# Patient Record
Sex: Male | Born: 1955 | Race: White | Hispanic: No | Marital: Married | Smoking: Never smoker
Health system: Southern US, Community
[De-identification: ages and names within clinical notes are randomized; demographics above are authoritative.]

## PROBLEM LIST (undated history)

## (undated) DIAGNOSIS — I442 Atrioventricular block, complete: Secondary | ICD-10-CM

## (undated) DIAGNOSIS — I428 Other cardiomyopathies: Secondary | ICD-10-CM

## (undated) HISTORY — PX: INSERT / REPLACE / REMOVE PACEMAKER: SUR710

## (undated) HISTORY — PX: VASECTOMY: SHX75

## (undated) HISTORY — DX: Atrioventricular block, complete: I44.2

## (undated) HISTORY — DX: Other cardiomyopathies: I42.8

## (undated) HISTORY — PX: UMBILICAL HERNIA REPAIR: SHX196

---

## 2004-11-02 ENCOUNTER — Ambulatory Visit: Payer: Self-pay | Admitting: Family Medicine

## 2005-06-27 ENCOUNTER — Ambulatory Visit: Payer: Self-pay | Admitting: Family Medicine

## 2005-07-06 ENCOUNTER — Ambulatory Visit: Payer: Self-pay | Admitting: Family Medicine

## 2005-10-16 ENCOUNTER — Ambulatory Visit: Payer: Self-pay | Admitting: Family Medicine

## 2005-11-08 ENCOUNTER — Ambulatory Visit: Payer: Self-pay | Admitting: Family Medicine

## 2015-07-12 DIAGNOSIS — E785 Hyperlipidemia, unspecified: Secondary | ICD-10-CM | POA: Insufficient documentation

## 2015-07-12 DIAGNOSIS — I10 Essential (primary) hypertension: Secondary | ICD-10-CM

## 2015-07-12 DIAGNOSIS — I429 Cardiomyopathy, unspecified: Secondary | ICD-10-CM | POA: Insufficient documentation

## 2015-07-12 DIAGNOSIS — Z950 Presence of cardiac pacemaker: Secondary | ICD-10-CM | POA: Insufficient documentation

## 2015-07-12 HISTORY — DX: Essential (primary) hypertension: I10

## 2015-07-12 HISTORY — DX: Cardiomyopathy, unspecified: I42.9

## 2015-07-12 HISTORY — DX: Hyperlipidemia, unspecified: E78.5

## 2015-07-12 HISTORY — DX: Presence of cardiac pacemaker: Z95.0

## 2015-11-01 DIAGNOSIS — I495 Sick sinus syndrome: Secondary | ICD-10-CM | POA: Insufficient documentation

## 2015-11-01 HISTORY — DX: Sick sinus syndrome: I49.5

## 2017-04-25 DIAGNOSIS — E782 Mixed hyperlipidemia: Secondary | ICD-10-CM | POA: Insufficient documentation

## 2017-04-25 DIAGNOSIS — K5730 Diverticulosis of large intestine without perforation or abscess without bleeding: Secondary | ICD-10-CM

## 2017-04-25 DIAGNOSIS — Z0000 Encounter for general adult medical examination without abnormal findings: Secondary | ICD-10-CM

## 2017-04-25 DIAGNOSIS — I472 Ventricular tachycardia, unspecified: Secondary | ICD-10-CM | POA: Insufficient documentation

## 2017-04-25 DIAGNOSIS — J309 Allergic rhinitis, unspecified: Secondary | ICD-10-CM | POA: Insufficient documentation

## 2017-04-25 DIAGNOSIS — I442 Atrioventricular block, complete: Secondary | ICD-10-CM | POA: Insufficient documentation

## 2017-04-25 HISTORY — DX: Encounter for general adult medical examination without abnormal findings: Z00.00

## 2017-04-25 HISTORY — DX: Allergic rhinitis, unspecified: J30.9

## 2017-04-25 HISTORY — DX: Diverticulosis of large intestine without perforation or abscess without bleeding: K57.30

## 2017-04-25 HISTORY — DX: Ventricular tachycardia: I47.2

## 2017-04-25 HISTORY — DX: Ventricular tachycardia, unspecified: I47.20

## 2017-04-25 HISTORY — DX: Mixed hyperlipidemia: E78.2

## 2017-05-01 DIAGNOSIS — Z131 Encounter for screening for diabetes mellitus: Secondary | ICD-10-CM | POA: Insufficient documentation

## 2017-05-01 HISTORY — DX: Encounter for screening for diabetes mellitus: Z13.1

## 2017-07-29 ENCOUNTER — Other Ambulatory Visit: Payer: Self-pay | Admitting: Cardiology

## 2018-04-29 DIAGNOSIS — M109 Gout, unspecified: Secondary | ICD-10-CM

## 2018-04-29 HISTORY — DX: Gout, unspecified: M10.9

## 2018-04-30 DIAGNOSIS — N183 Chronic kidney disease, stage 3 unspecified: Secondary | ICD-10-CM

## 2018-04-30 DIAGNOSIS — Z87448 Personal history of other diseases of urinary system: Secondary | ICD-10-CM

## 2018-04-30 HISTORY — DX: Chronic kidney disease, stage 3 unspecified: N18.30

## 2018-04-30 HISTORY — DX: Personal history of other diseases of urinary system: Z87.448

## 2018-12-17 DIAGNOSIS — I447 Left bundle-branch block, unspecified: Secondary | ICD-10-CM | POA: Insufficient documentation

## 2018-12-17 DIAGNOSIS — IMO0001 Reserved for inherently not codable concepts without codable children: Secondary | ICD-10-CM

## 2018-12-17 HISTORY — DX: Reserved for inherently not codable concepts without codable children: IMO0001

## 2018-12-17 HISTORY — DX: Left bundle-branch block, unspecified: I44.7

## 2019-02-03 ENCOUNTER — Ambulatory Visit: Payer: Self-pay | Admitting: Cardiology

## 2019-02-05 ENCOUNTER — Encounter: Payer: Self-pay | Admitting: Cardiology

## 2019-02-05 ENCOUNTER — Other Ambulatory Visit: Payer: Self-pay

## 2019-02-05 ENCOUNTER — Ambulatory Visit (INDEPENDENT_AMBULATORY_CARE_PROVIDER_SITE_OTHER): Payer: BLUE CROSS/BLUE SHIELD | Admitting: Cardiology

## 2019-02-05 VITALS — BP 130/80 | Ht 68.5 in | Wt 216.0 lb

## 2019-02-05 DIAGNOSIS — I10 Essential (primary) hypertension: Secondary | ICD-10-CM | POA: Diagnosis not present

## 2019-02-05 DIAGNOSIS — I442 Atrioventricular block, complete: Secondary | ICD-10-CM | POA: Diagnosis not present

## 2019-02-05 DIAGNOSIS — Z950 Presence of cardiac pacemaker: Secondary | ICD-10-CM | POA: Diagnosis not present

## 2019-02-05 DIAGNOSIS — I420 Dilated cardiomyopathy: Secondary | ICD-10-CM | POA: Diagnosis not present

## 2019-02-05 NOTE — Patient Instructions (Signed)
Medication Instructions:  Your physician recommends that you continue on your current medications as directed. Please refer to the Current Medication list given to you today.  If you need a refill on your cardiac medications before your next appointment, please call your pharmacy.   Lab work: None.  If you have labs (blood work) drawn today and your tests are completely normal, you will receive your results only by: . MyChart Message (if you have MyChart) OR . A paper copy in the mail If you have any lab test that is abnormal or we need to change your treatment, we will call you to review the results.  Testing/Procedures: None.   Follow-Up: At [facility], you and your health needs are our priority.  As part of our continuing mission to provide you with exceptional heart care, we have created designated Provider Care Teams.  These Care Teams include your primary Cardiologist (physician) and Advanced Practice Providers (APPs -  Physician Assistants and Nurse Practitioners) who all work together to provide you with the care you need, when you need it. You will need a follow up appointment in 3 months.  Please call our office 2 months in advance to schedule this appointment.  You may see No primary care provider on file. or another member of our [facility] Provider Team in [place]: Brian Munley, MD . Rajan Revankar, MD  Any Other Special Instructions Will Be Listed Below (If Applicable).     

## 2019-02-05 NOTE — Progress Notes (Signed)
Cardiology Office Note:    Date:  02/05/2019   ID:  Seth Schneider, DOB December 06, 1955, MRN [id number]  PCP:  Olive Bass, MD  Cardiologist:  Gypsy Balsam, MD    Referring MD: Olive Bass, MD   Chief Complaint  Patient presents with   Follow-up  I need to be reestablished with the patient in the practice  History of Present Illness:    Seth Schneider is a 63 y.o. male in 2014 left started having problems he was identified to have significant cardiomyopathy with significantly diminished left ventricular ejection fraction.  Cardiac catheterization was done showed normal coronaries.  After that he will develop periods of complete heart block and pacemaker has been implanted.  Since that time he has been doing well.  I seen him last time around 2017.  Echocardiogram at that time showed normalization of left ventricular ejection fraction.  After that we left the practice and he was followed by [place] [facility].  He did have echocardiogram done on 27 May on top of that he did see EP team and he was identified to have some excessive noise on the ventricular lead.  Conversation initiated about potentially putting extra right ventricle lead.  Apparently the reason for echocardiogram was done to determine left ventricular ejection fraction to determine exactly what is needed options are if he truly still diminished left ventricular ejection fraction ICD can be considered however if this is the case I would definitely favor augment medical therapy which is not sufficient at this for now.  Second issue was he does have left bundle branch block to begin with.  If he got mildly diminished left ventricular ejection fraction additional left ventricular lead can be beneficial.  He was unhappy with the service that he received and he want to get second opinion regarding that issue.  Overall he is doing well he denies having any chest pain tightness squeezing pressure burning chest.  He can do what he  wants.  He does not have any syncope or dizziness.  No past medical history on file.    Current Medications: Current Meds  Medication Sig   aspirin EC 81 MG tablet Take 1 tablet by mouth 2 (two) times daily.   carvedilol (COREG) 12.5 MG tablet Take 2 tablets by mouth 2 (two) times daily.      Allergies:   Patient has no known allergies.   Social History   Socioeconomic History   Marital status: Married    Spouse name: Not on file   Number of children: Not on file   Years of education: Not on file   Highest education level: Not on file  Occupational History   Not on file  Social Needs   Financial resource strain: Not on file   Food insecurity:    Worry: Not on file    Inability: Not on file   Transportation needs:    Medical: Not on file    Non-medical: Not on file  Tobacco Use   Smoking status: Never Smoker   Smokeless tobacco: Never Used  Substance and Sexual Activity   Alcohol use: Not Currently    Frequency: Never   Drug use: Never   Sexual activity: Not on file  Lifestyle   Physical activity:    Days per week: Not on file    Minutes per session: Not on file   Stress: Not on file  Relationships   Social connections:    Talks on phone: Not on file  Gets together: Not on file    Attends religious service: Not on file    Active member of club or organization: Not on file    Attends meetings of clubs or organizations: Not on file    Relationship status: Not on file  Other Topics Concern   Not on file  Social History Narrative   Not on file     Family History: The patient's family history includes Anxiety disorder in his mother; Hypertension in his mother; Parkinson's disease in his father; Thyroid disease in his mother. ROS:   Please see the history of present illness.    All 14 point review of systems negative except as described per history of present illness  EKGs/Labs/Other Studies Reviewed:      Recent Labs: No results  found for requested labs within last 8760 hours.  Recent Lipid Panel No results found for: CHOL, TRIG, HDL, CHOLHDL, VLDL, LDLCALC, LDLDIRECT  Physical Exam:    VS:  BP 130/80    Ht 5' 8.5" (1.74 m)    Wt 216 lb (98 kg)    SpO2 97%    BMI 32.36 kg/m     Wt Readings from Last 3 Encounters:  02/05/19 216 lb (98 kg)     GEN:  Well nourished, well developed in no acute distress HEENT: Normal NECK: No JVD; No carotid bruits LYMPHATICS: No lymphadenopathy CARDIAC: RRR, no murmurs, no rubs, no gallops RESPIRATORY:  Clear to auscultation without rales, wheezing or rhonchi  ABDOMEN: Soft, non-tender, non-distended MUSCULOSKELETAL:  No edema; No deformity  SKIN: Warm and dry LOWER EXTREMITIES: no swelling NEUROLOGIC:  Alert and oriented x 3 PSYCHIATRIC:  Normal affect   ASSESSMENT:    1. Complete heart block (HCC)   2. Dilated cardiomyopathy (HCC)   3. Essential hypertension   4. Pacemaker    PLAN:    In order of problems listed above:  1. Complete heart block.  Addressed with a pacemaker is some issue with ventricular lead with excessive noise conversation initiated about potentially implanting additional right ventricular lead or upgrading to biventricular pacer.  I talked to him in length about this and I told him what the rationale for those potential maneuvers however the most important piece of information that is still missing is his echocardiogram based on his ejection fraction will determine which way will be the best to proceed.  On top of that he want to get second opinion regarding his pacemaker.  Therefore, I will refer him to our EP team. 2. History of cardiomyopathy which was dilated with ejection fraction severely diminished but latest echocardiogram from 2017 showed normal left ventricular ejection fraction. Essential hypertension blood pressure today sufficiently controlled which I will continue. 4.  Pacemaker plan as outlined above. 5.  Dyslipidemia his cholesterol  is always unacceptably high but he still refused to take any medications for it  Medication Adjustments/Labs and Tests Ordered: Current medicines are reviewed at length with the patient today.  Concerns regarding medicines are outlined above.  No orders of the defined types were placed in this encounter.  Medication changes: No orders of the defined types were placed in this encounter.   Signed, Georgeanna Lea, MD, [facility] 02/05/2019 4:11 PM    [facility]

## 2019-02-06 ENCOUNTER — Telehealth: Payer: Self-pay | Admitting: *Deleted

## 2019-02-06 DIAGNOSIS — Z950 Presence of cardiac pacemaker: Secondary | ICD-10-CM

## 2019-02-06 DIAGNOSIS — I429 Cardiomyopathy, unspecified: Secondary | ICD-10-CM

## 2019-02-06 NOTE — Telephone Encounter (Signed)
Patient reports he hasn't had echo done yet and would like [place] to put a order in and schedule here. Will consult with Dr. Bing Matter.

## 2019-02-06 NOTE — Telephone Encounter (Signed)
Pt was mistaken about having an echo. Please call and verify and put in order.

## 2019-02-09 NOTE — Telephone Encounter (Signed)
Order has been place. No answer when calling and could not leave voicemail

## 2019-02-09 NOTE — Telephone Encounter (Signed)
Schedule for echo please

## 2019-02-09 NOTE — Addendum Note (Signed)
Addended by: Aleatha Borer on: 02/09/2019 12:00 PM   Modules accepted: Orders

## 2019-02-16 NOTE — Addendum Note (Signed)
Addended by: Ashok Norris on: 02/16/2019 10:48 AM   Modules accepted: Orders

## 2019-03-09 ENCOUNTER — Other Ambulatory Visit: Payer: Self-pay

## 2019-03-09 ENCOUNTER — Telehealth: Payer: Self-pay | Admitting: *Deleted

## 2019-03-09 ENCOUNTER — Ambulatory Visit (INDEPENDENT_AMBULATORY_CARE_PROVIDER_SITE_OTHER): Payer: BC Managed Care – PPO

## 2019-03-09 DIAGNOSIS — I429 Cardiomyopathy, unspecified: Secondary | ICD-10-CM

## 2019-03-09 DIAGNOSIS — Z950 Presence of cardiac pacemaker: Secondary | ICD-10-CM

## 2019-03-09 NOTE — Telephone Encounter (Signed)
Pt has appt with Dr. Theda Belfast on November 26, 2019 for pace maker check. Should he keep this or do we want to refer him to someone in our network? PLease advise.

## 2019-03-09 NOTE — Progress Notes (Deleted)
Complete echocardiogram has been performed.  Jimmy Aaric Dolph RDCS, RVT 

## 2019-03-09 NOTE — Progress Notes (Signed)
Complete echocardiogram has been performed.  Jimmy Patton Rabinovich RDCS, RVT 

## 2019-03-10 NOTE — Telephone Encounter (Signed)
Please advise what you would like

## 2019-03-11 ENCOUNTER — Other Ambulatory Visit: Payer: Self-pay | Admitting: *Deleted

## 2019-03-11 DIAGNOSIS — I442 Atrioventricular block, complete: Secondary | ICD-10-CM

## 2019-03-11 DIAGNOSIS — I10 Essential (primary) hypertension: Secondary | ICD-10-CM

## 2019-03-11 NOTE — Telephone Encounter (Signed)
Telephone call to patient. Spoke wit wife Santiago Glad. Informed her I have s[poke with Dr. Agustin Cree and he wants to refer him to Dr. Curt Bears (EP) for his pacemaker problems. Also that he wanted a BMP as his earliest convienence. Referral made and lab placed . Wife verbalized understanding.

## 2019-03-12 ENCOUNTER — Telehealth: Payer: Self-pay | Admitting: *Deleted

## 2019-03-12 DIAGNOSIS — I429 Cardiomyopathy, unspecified: Secondary | ICD-10-CM

## 2019-03-12 LAB — BASIC METABOLIC PANEL
BUN/Creatinine Ratio: 14 (ref 10–24)
BUN: 14 mg/dL (ref 8–27)
CO2: 23 mmol/L (ref 20–29)
Calcium: 9.4 mg/dL (ref 8.6–10.2)
Chloride: 104 mmol/L (ref 96–106)
Creatinine, Ser: 1.03 mg/dL (ref 0.76–1.27)
GFR calc Af Amer: 91 mL/min/{1.73_m2} (ref >59)
GFR calc non Af Amer: 79 mL/min/{1.73_m2} (ref >59)
Glucose: 88 mg/dL (ref 65–99)
Potassium: 4.5 mmol/L (ref 3.5–5.2)
Sodium: 142 mmol/L (ref 134–144)

## 2019-03-12 MED ORDER — LOSARTAN POTASSIUM 25 MG PO TABS: 25.0000 mg | ORAL_TABLET | Freq: Two times a day (BID) | ORAL | 1 refills | Status: DC

## 2019-03-12 NOTE — Telephone Encounter (Signed)
-----   Message from Park Liter, MD sent at 03/12/2019  1:06 PM EDT ----- chem7 ok, start losartan 25 bid  chem7 in 1 week

## 2019-03-12 NOTE — Telephone Encounter (Signed)
Telephone call to patient. Informed of lab results and need to start losartan 25 mg twice daily with a repeat BMP in 1 week

## 2019-03-18 ENCOUNTER — Telehealth: Payer: Self-pay | Admitting: Cardiology

## 2019-03-18 NOTE — Telephone Encounter (Signed)

## 2019-03-20 ENCOUNTER — Encounter: Payer: Self-pay | Admitting: Cardiology

## 2019-03-20 ENCOUNTER — Ambulatory Visit: Payer: BC Managed Care – PPO | Admitting: Cardiology

## 2019-03-20 ENCOUNTER — Other Ambulatory Visit: Payer: Self-pay

## 2019-03-20 VITALS — BP 126/72 | HR 67 | Ht 68.5 in | Wt 216.0 lb

## 2019-03-20 DIAGNOSIS — I428 Other cardiomyopathies: Secondary | ICD-10-CM

## 2019-03-20 LAB — CUP PACEART INCLINIC DEVICE CHECK
Brady Statistic RA Percent Paced: 20 %
Brady Statistic RV Percent Paced: 1 % — CL
Date Time Interrogation Session: 20200717040000
Implantable Lead Implant Date: 20031201
Implantable Lead Implant Date: 20031201
Implantable Lead Location: 753859
Implantable Lead Location: 753860
Implantable Lead Model: 4470
Implantable Lead Model: 5076
Implantable Lead Serial Number: 371531
Implantable Pulse Generator Implant Date: 20170629
Lead Channel Impedance Value: 407 Ohm
Lead Channel Impedance Value: 407 Ohm
Lead Channel Impedance Value: 441 Ohm
Lead Channel Impedance Value: 441 Ohm
Lead Channel Pacing Threshold Amplitude: 0.1 V
Lead Channel Pacing Threshold Amplitude: 0.1 V
Lead Channel Pacing Threshold Amplitude: 0.5 V
Lead Channel Pacing Threshold Pulse Width: 0.4 ms
Lead Channel Pacing Threshold Pulse Width: 0.4 ms
Lead Channel Pacing Threshold Pulse Width: 0.4 ms
Lead Channel Sensing Intrinsic Amplitude: 7.4 mV
Lead Channel Sensing Intrinsic Amplitude: 7.4 mV
Lead Channel Sensing Intrinsic Amplitude: 7.4 mV
Lead Channel Sensing Intrinsic Amplitude: 7.4 mV
Lead Channel Setting Pacing Amplitude: 2 V
Lead Channel Setting Pacing Amplitude: 2.5 V
Lead Channel Setting Pacing Pulse Width: 0.4 ms
Lead Channel Setting Sensing Sensitivity: 3.5 mV
Pulse Gen Serial Number: 755468

## 2019-03-20 NOTE — Progress Notes (Signed)
Electrophysiology Office Note   Date:  03/20/2019   ID:  Seth Schneider, DOB June 29, 1956, MRN [id number]  PCP:  Algis Greenhouse, MD  Cardiologist: Agustin Cree Primary Electrophysiologist:  Marlet Korte [name], MD    No chief complaint on file.    History of Present Illness: Seth Schneider is a 63 y.o. male who is being seen today for the evaluation of CHF at the request of Park Liter, MD. Presenting today for electrophysiology evaluation.  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  He has complete heart block is now status post pacemaker.  He has had an echo that showed normalization of his LV function.  Today, he denies symptoms of palpitations, chest pain, shortness of breath, orthopnea, PND, lower extremity edema, claudication, dizziness, presyncope, syncope, bleeding, or neurologic sequela. The patient is tolerating medications without difficulties.    Past Medical History:  Diagnosis Date  . Complete heart block ([place])   . Nonischemic cardiomyopathy [facility])    Past Surgical History:  Procedure Laterality Date  . INSERT / REPLACE / REMOVE PACEMAKER     [place]  . UMBILICAL HERNIA REPAIR    . VASECTOMY       Current Outpatient Medications  Medication Sig Dispense Refill  . aspirin EC 81 MG tablet Take 1 tablet by mouth 2 (two) times daily.    . carvedilol (COREG) 12.5 MG tablet Take 2 tablets by mouth 2 (two) times daily.     Marland Kitchen losartan (COZAAR) 25 MG tablet Take 1 tablet (25 mg total) by mouth 2 (two) times a day. 60 tablet 1   No current facility-administered medications for this visit.     Allergies:   Patient has no known allergies.   Social History:  The patient  reports that he has never smoked. He has never used smokeless tobacco. He reports previous alcohol use. He reports that he does not use drugs.   Family History:  The patient's family history includes Anxiety disorder in his mother; Hypertension in his mother;  Parkinson's disease in his father; Thyroid disease in his mother.    ROS:  Please see the history of present illness.   Otherwise, review of systems is positive for non.   All other systems are reviewed and negative.    PHYSICAL EXAM: VS:  BP 126/72   Pulse 67   Ht 5' 8.5" (1.74 m)   Wt 216 lb (98 kg)   BMI 32.36 kg/m  , BMI Body mass index is 32.36 kg/m. GEN: Well nourished, well developed, in no acute distress  HEENT: normal  Neck: no JVD, carotid bruits, or masses Cardiac: RRR; no murmurs, rubs, or gallops,no edema  Respiratory:  clear to auscultation bilaterally, normal work of breathing GI: soft, nontender, nondistended, + BS MS: no deformity or atrophy  Skin: warm and dry, device pocket is well healed Neuro:  Strength and sensation are intact Psych: euthymic mood, full affect  EKG:  EKG is ordered today. Personal review of the ekg ordered shows SR, IVCD  Device interrogation is reviewed today in detail.  See PaceArt for details.   Recent Labs: 03/11/2019: BUN 14; Creatinine, Ser 1.03; Potassium 4.5; Sodium 142    Lipid Panel  No results found for: CHOL, TRIG, HDL, CHOLHDL, VLDL, LDLCALC, LDLDIRECT   Wt Readings from Last 3 Encounters:  03/20/19 216 lb (98 kg)  02/05/19 216 lb (98 kg)      Other studies Reviewed: Additional studies/ records that were  reviewed today include: TTE 03/09/19  Review of the above records today demonstrates:   1. The left ventricle has mild-moderately reduced systolic function, with an ejection fraction of 40-45%. The cavity size was normal. There is moderate concentric left ventricular hypertrophy. Left ventricular diastolic Doppler parameters are consistent  with impaired relaxation. There is abnormal septal motion with wide QRS BBB or paced rhythm. Left ventrical global hypokinesis without regional wall motion abnormalities.  2. The right ventricle has normal systolic function. The cavity was normal. There is no increase in right  ventricular wall thickness.  3. No evidence of mitral valve stenosis.  4. The aortic valve is tricuspid. No stenosis of the aortic valve.  5. The aortic root, ascending aorta and aortic arch are normal in size and structure.  6. 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   ASSESSMENT AND PLAN:  1.  Complete heart block: Unfortunately does have excessive RV lead noise.  Fortunately he has had an echo with improved ejection fraction at 40 to 45%.  At this point a defibrillator is not indicated.  He does have some noise on his pacemaker RV lead.  He paces less than 1% of the time.  Due to that, we Seth Schneider increase his sensitivity so that he does not over sense.  If this does not fix the problem down the road, he may need a lead revision.  2.  Nonischemic dilated cardiomyopathy: Fortunately ejection fraction has improved.  Continue with current management per primary cardiology.    Current medicines are reviewed at length with the patient today.   The patient does not have concerns regarding his medicines.  The following changes were made today:  none  Labs/ tests ordered today include:  Orders Placed This Encounter  Procedures  . EKG 12-Lead     Disposition:   FU with Seth Schneider 1 year  Signed, Saliou Barnier Jorja Loa, MD  03/20/2019 2:28 PM     [facility] [address] [place] [address] [phone] (office) [phone] (fax)

## 2019-03-20 NOTE — Patient Instructions (Addendum)
Medication Instructions:  Your physician recommends that you continue on your current medications as directed. Please refer to the Current Medication list given to you today.  * If you need a refill on your cardiac medications before your next appointment, please call your pharmacy.   Labwork: None ordered  Testing/Procedures: None ordered  Follow-Up: Remote monitoring is used to monitor your Pacemaker from home. This monitoring reduces the number of office visits required to check your device to one time per year. It allows [place] to keep an eye on the functioning of your device to ensure it is working properly. You are scheduled for a device check from home on 06/22/19. You may send your transmission at any time that day. If you have a wireless device, the transmission will be sent automatically. After your physician reviews your transmission, you will receive a postcard with your next transmission date.  Your physician wants you to follow-up in: 1 year with Dr. Curt Bears.  You will receive a reminder letter in the mail two months in advance. If you don't receive a letter, please call our office to schedule the follow-up appointment.  Thank you for choosing [facility]!!   Trinidad Curet, RN [phone]

## 2019-03-25 LAB — BASIC METABOLIC PANEL
BUN/Creatinine Ratio: 15 (ref 10–24)
BUN: 17 mg/dL (ref 8–27)
CO2: 21 mmol/L (ref 20–29)
Calcium: 9.7 mg/dL (ref 8.6–10.2)
Chloride: 104 mmol/L (ref 96–106)
Creatinine, Ser: 1.11 mg/dL (ref 0.76–1.27)
GFR calc Af Amer: 82 mL/min/{1.73_m2} (ref >59)
GFR calc non Af Amer: 71 mL/min/{1.73_m2} (ref >59)
Glucose: 87 mg/dL (ref 65–99)
Potassium: 4.7 mmol/L (ref 3.5–5.2)
Sodium: 140 mmol/L (ref 134–144)

## 2019-03-27 ENCOUNTER — Telehealth: Payer: Self-pay | Admitting: Cardiology

## 2019-03-27 NOTE — Telephone Encounter (Signed)
Wants lab results from Tuesday

## 2019-03-30 MED ORDER — LOSARTAN POTASSIUM 25 MG PO TABS: 25.0000 mg | ORAL_TABLET | Freq: Two times a day (BID) | ORAL | 0 refills | Status: DC

## 2019-03-30 NOTE — Telephone Encounter (Signed)
Patient's wife informed of results. Losartan refilled.

## 2019-03-30 NOTE — Addendum Note (Signed)
Addended by: Linna Hoff R on: 03/30/2019 11:13 AM   Modules accepted: Orders

## 2019-04-20 ENCOUNTER — Telehealth: Payer: Self-pay | Admitting: Cardiology

## 2019-04-20 NOTE — Telephone Encounter (Signed)
Call carvadolol to [facility]

## 2019-04-22 ENCOUNTER — Other Ambulatory Visit: Payer: Self-pay | Admitting: *Deleted

## 2019-04-22 MED ORDER — CARVEDILOL 12.5 MG PO TABS: 25.0000 mg | ORAL_TABLET | Freq: Two times a day (BID) | ORAL | 1 refills | Status: DC

## 2019-04-22 NOTE — Telephone Encounter (Signed)
Carvedilol 12.5 mg twice daily called into [facility]

## 2019-05-07 ENCOUNTER — Encounter: Payer: Self-pay | Admitting: Cardiology

## 2019-05-07 ENCOUNTER — Other Ambulatory Visit: Payer: Self-pay

## 2019-05-07 ENCOUNTER — Ambulatory Visit (INDEPENDENT_AMBULATORY_CARE_PROVIDER_SITE_OTHER): Payer: BC Managed Care – PPO | Admitting: Cardiology

## 2019-05-07 VITALS — BP 116/78 | HR 74 | Ht 68.5 in | Wt 218.2 lb

## 2019-05-07 DIAGNOSIS — I429 Cardiomyopathy, unspecified: Secondary | ICD-10-CM | POA: Diagnosis not present

## 2019-05-07 DIAGNOSIS — I447 Left bundle-branch block, unspecified: Secondary | ICD-10-CM | POA: Diagnosis not present

## 2019-05-07 DIAGNOSIS — I495 Sick sinus syndrome: Secondary | ICD-10-CM | POA: Diagnosis not present

## 2019-05-07 DIAGNOSIS — I10 Essential (primary) hypertension: Secondary | ICD-10-CM | POA: Diagnosis not present

## 2019-05-07 NOTE — Progress Notes (Signed)
Cardiology Office Note:    Date:  05/07/2019   ID:  BURHAN SMYRE, DOB July 15, 1956, MRN [id number]  PCP:  Olive Bass, MD  Cardiologist:  Gypsy Balsam, MD    Referring MD: Olive Bass, MD   Chief Complaint  Patient presents with  . Follow-up  Doing very well  History of Present Illness:    Seth Schneider is a 62 y.o. male with history of cardiomyopathy, left bundle branch block, sick sinus syndrome, pacemaker present he comes today to my office for follow-up cardiac wise doing well.  Denies have any chest pain tightness squeezing pressure burning chest.  He does have a problem ventricular lead he was seen by EP team consideration was potentially upgrading his device to by V pacing.  Echocardiogram being done ejection fraction 4045% therefore he does not qualify for defibrillator.  Clinically he is doing well however I will try to augment his medical therapy.  7 will be checked today if Chem-7 is fine will double the dose of losartan.  Past Medical History:  Diagnosis Date  . Complete heart block (HCC)   . Nonischemic cardiomyopathy [facility])     Past Surgical History:  Procedure Laterality Date  . INSERT / REPLACE / REMOVE PACEMAKER     [place]  . UMBILICAL HERNIA REPAIR    . VASECTOMY      Current Medications: Current Meds  Medication Sig  . aspirin EC 81 MG tablet Take 1 tablet by mouth daily.   . carvedilol (COREG) 12.5 MG tablet Take 2 tablets (25 mg total) by mouth 2 (two) times daily.  Marland Kitchen losartan (COZAAR) 25 MG tablet Take 1 tablet (25 mg total) by mouth 2 (two) times a day.     Allergies:   Patient has no known allergies.   Social History   Socioeconomic History  . Marital status: Married    Spouse name: Not on file  . Number of children: Not on file  . Years of education: Not on file  . Highest education level: Not on file  Occupational History  . Not on file  Social Needs  . Financial resource strain: Not on file  . Food insecurity    Worry:  Not on file    Inability: Not on file  . Transportation needs    Medical: Not on file    Non-medical: Not on file  Tobacco Use  . Smoking status: Never Smoker  . Smokeless tobacco: Never Used  Substance and Sexual Activity  . Alcohol use: Not Currently    Frequency: Never  . Drug use: Never  . Sexual activity: Not on file  Lifestyle  . Physical activity    Days per week: Not on file    Minutes per session: Not on file  . Stress: Not on file  Relationships  . Social Musician on phone: Not on file    Gets together: Not on file    Attends religious service: Not on file    Active member of club or organization: Not on file    Attends meetings of clubs or organizations: Not on file    Relationship status: Not on file  Other Topics Concern  . Not on file  Social History Narrative  . Not on file     Family History: The patient's family history includes Anxiety disorder in his mother; Hypertension in his mother; Parkinson's disease in his father; Thyroid disease in his mother. ROS:   Please see the  history of present illness.    All 14 point review of systems negative except as described per history of present illness  EKGs/Labs/Other Studies Reviewed:      Recent Labs: 03/24/2019: BUN 17; Creatinine, Ser 1.11; Potassium 4.7; Sodium 140  Recent Lipid Panel No results found for: CHOL, TRIG, HDL, CHOLHDL, VLDL, LDLCALC, LDLDIRECT  Physical Exam:    VS:  BP 116/78   Pulse 74   Ht 5' 8.5" (1.74 m)   Wt 218 lb 3.2 oz (99 kg)   SpO2 97%   BMI 32.69 kg/m     Wt Readings from Last 3 Encounters:  05/07/19 218 lb 3.2 oz (99 kg)  03/20/19 216 lb (98 kg)  02/05/19 216 lb (98 kg)     GEN:  Well nourished, well developed in no acute distress HEENT: Normal NECK: No JVD; No carotid bruits LYMPHATICS: No lymphadenopathy CARDIAC: RRR, no murmurs, no rubs, no gallops RESPIRATORY:  Clear to auscultation without rales, wheezing or rhonchi  ABDOMEN: Soft, non-tender,  non-distended MUSCULOSKELETAL:  No edema; No deformity  SKIN: Warm and dry LOWER EXTREMITIES: no swelling NEUROLOGIC:  Alert and oriented x 3 PSYCHIATRIC:  Normal affect   ASSESSMENT:    1. SSS (sick sinus syndrome) ([place])   2. LBBB (left bundle branch block)   3. Essential hypertension   4. Cardiomyopathy, unspecified type ([place])    PLAN:    In order of problems listed above:  1. Sick sinus syndrome this being addressed with pacemaker.  Probably the ventricular lead.  Followed by EP team. 2. Left bundle branch block.  He can benefit from biventricular pacing therefore in the future if we need to change his device some thought should be given to consideration of by V pacing. 3. Essential hypertension blood pressure well controlled continue present management 4. Cardiomyopathy will check Chem-7 if Chem-7 is fine will double losartan   Medication Adjustments/Labs and Tests Ordered: Current medicines are reviewed at length with the patient today.  Concerns regarding medicines are outlined above.  No orders of the defined types were placed in this encounter.  Medication changes: No orders of the defined types were placed in this encounter.   Signed, Park Liter, MD, [facility] 05/07/2019 4:37 PM    [place]

## 2019-05-07 NOTE — Patient Instructions (Signed)
Medication Instructions:  Your physician recommends that you continue on your current medications as directed. Please refer to the Current Medication list given to you today.  If you need a refill on your cardiac medications before your next appointment, please call your pharmacy.   Lab work: Your physician recommends that you have the following labs drawn: BMP  If you have labs (blood work) drawn today and your tests are completely normal, you will receive your results only by: Marland Kitchen MyChart Message (if you have MyChart) OR . A paper copy in the mail If you have any lab test that is abnormal or we need to change your treatment, we will call you to review the results.  Testing/Procedures: None ordered  Follow-Up: At [facility], you and your health needs are our priority.  As part of our continuing mission to provide you with exceptional heart care, we have created designated Provider Care Teams.  These Care Teams include your primary Cardiologist (physician) and Advanced Practice Providers (APPs -  Physician Assistants and Nurse Practitioners) who all work together to provide you with the care you need, when you need it. You will need a follow up appointment in 3 months.  You may see Jenne Campus or another member of our [employer] Provider Team in [place]: Shirlee More, MD . Jyl Heinz, MD

## 2019-05-08 ENCOUNTER — Telehealth: Payer: Self-pay | Admitting: Family

## 2019-05-08 DIAGNOSIS — I10 Essential (primary) hypertension: Secondary | ICD-10-CM

## 2019-05-08 LAB — BASIC METABOLIC PANEL
BUN/Creatinine Ratio: 18 (ref 10–24)
BUN: 19 mg/dL (ref 8–27)
CO2: 20 mmol/L (ref 20–29)
Calcium: 9.7 mg/dL (ref 8.6–10.2)
Chloride: 103 mmol/L (ref 96–106)
Creatinine, Ser: 1.05 mg/dL (ref 0.76–1.27)
GFR calc Af Amer: 88 mL/min/{1.73_m2} (ref >59)
GFR calc non Af Amer: 76 mL/min/{1.73_m2} (ref >59)
Glucose: 80 mg/dL (ref 65–99)
Potassium: 4.7 mmol/L (ref 3.5–5.2)
Sodium: 139 mmol/L (ref 134–144)

## 2019-05-08 MED ORDER — LOSARTAN POTASSIUM 50 MG PO TABS: 50.0000 mg | ORAL_TABLET | Freq: Two times a day (BID) | ORAL | 3 refills | Status: DC

## 2019-05-08 NOTE — Telephone Encounter (Signed)
Called and left message per DPR.   BMP with normal kidney function and electrolytes. Increase Losartan to 50mg  twice daily. I have sent a prescription for 50mg  tablets. In the interim, he may take two 25mg  tablets twice daily to use up his current supply. Recheck BMP in 1 week.   He was asked to present to the office in 1 week between 8 and 4:30 to have his labs BMP repeated.   I have placed the order.   Loel Dubonnet, NP

## 2019-05-08 NOTE — Telephone Encounter (Signed)
-----   Message from Park Liter, MD sent at 05/08/2019 10:52 AM EDT ----- Double losatran dose to 50 bid  chem7 in 1 week

## 2019-05-20 LAB — BASIC METABOLIC PANEL
BUN/Creatinine Ratio: 17 (ref 10–24)
BUN: 17 mg/dL (ref 8–27)
CO2: 20 mmol/L (ref 20–29)
Calcium: 9.4 mg/dL (ref 8.6–10.2)
Chloride: 106 mmol/L (ref 96–106)
Creatinine, Ser: 1 mg/dL (ref 0.76–1.27)
GFR calc Af Amer: 93 mL/min/{1.73_m2} (ref >59)
GFR calc non Af Amer: 81 mL/min/{1.73_m2} (ref >59)
Glucose: 80 mg/dL (ref 65–99)
Potassium: 4.6 mmol/L (ref 3.5–5.2)
Sodium: 142 mmol/L (ref 134–144)

## 2019-06-22 ENCOUNTER — Encounter: Payer: BC Managed Care – PPO | Admitting: *Deleted

## 2019-07-07 ENCOUNTER — Ambulatory Visit (INDEPENDENT_AMBULATORY_CARE_PROVIDER_SITE_OTHER): Payer: BC Managed Care – PPO | Admitting: *Deleted

## 2019-07-07 DIAGNOSIS — I447 Left bundle-branch block, unspecified: Secondary | ICD-10-CM | POA: Diagnosis not present

## 2019-07-07 DIAGNOSIS — I442 Atrioventricular block, complete: Secondary | ICD-10-CM

## 2019-07-07 LAB — CUP PACEART REMOTE DEVICE CHECK
Battery Remaining Longevity: 138 mo
Battery Remaining Percentage: 100 %
Brady Statistic RA Percent Paced: 24 %
Brady Statistic RV Percent Paced: 0 %
Date Time Interrogation Session: 20201103093200
Implantable Lead Implant Date: 20031201
Implantable Lead Implant Date: 20031201
Implantable Lead Location: 753859
Implantable Lead Location: 753860
Implantable Lead Model: 4470
Implantable Lead Model: 5076
Implantable Lead Serial Number: 371531
Implantable Pulse Generator Implant Date: 20170629
Lead Channel Impedance Value: 400 Ohm
Lead Channel Impedance Value: 456 Ohm
Lead Channel Pacing Threshold Amplitude: 0.5 V
Lead Channel Pacing Threshold Amplitude: 0.9 V
Lead Channel Pacing Threshold Pulse Width: 0.4 ms
Lead Channel Pacing Threshold Pulse Width: 0.4 ms
Lead Channel Setting Pacing Amplitude: 2 V
Lead Channel Setting Pacing Amplitude: 2.5 V
Lead Channel Setting Pacing Pulse Width: 0.4 ms
Lead Channel Setting Sensing Sensitivity: 3.5 mV
Pulse Gen Serial Number: 755468

## 2019-07-27 ENCOUNTER — Other Ambulatory Visit: Payer: Self-pay | Admitting: Cardiology

## 2019-07-27 NOTE — Progress Notes (Signed)
Remote pacemaker transmission.   

## 2019-08-17 ENCOUNTER — Encounter: Payer: Self-pay | Admitting: Cardiology

## 2019-08-17 ENCOUNTER — Other Ambulatory Visit: Payer: Self-pay

## 2019-08-17 ENCOUNTER — Ambulatory Visit (INDEPENDENT_AMBULATORY_CARE_PROVIDER_SITE_OTHER): Payer: BC Managed Care – PPO | Admitting: Cardiology

## 2019-08-17 VITALS — BP 118/62 | HR 64 | Ht 68.5 in | Wt 223.0 lb

## 2019-08-17 DIAGNOSIS — I10 Essential (primary) hypertension: Secondary | ICD-10-CM | POA: Diagnosis not present

## 2019-08-17 DIAGNOSIS — I420 Dilated cardiomyopathy: Secondary | ICD-10-CM | POA: Diagnosis not present

## 2019-08-17 DIAGNOSIS — Z950 Presence of cardiac pacemaker: Secondary | ICD-10-CM | POA: Diagnosis not present

## 2019-08-17 MED ORDER — ENTRESTO 24-26 MG PO TABS: 1.0000 | ORAL_TABLET | Freq: Two times a day (BID) | ORAL | 1 refills | Status: DC

## 2019-08-17 NOTE — Progress Notes (Signed)
Cardiology Office Note:    Date:  08/17/2019   ID:  Seth Schneider, DOB 1955-09-22, MRN [id number]  PCP:  Olive Bass, MD  Cardiologist:  Gypsy Balsam, MD    Referring MD: Olive Bass, MD   Chief Complaint  Patient presents with  . Follow-up  Doing well  History of Present Illness:    Seth Schneider is a 63 y.o. male past medical history significant for dual-chamber pacemaker, excessive noise on the ventricular lead but he was only used at least 1% of time, cardiomyopathy with latest ejection fraction 40 to 45% previously much worse, complete heart block.  He comes today to my office for follow-up overall doing great.  Denies have any chest pain, tightness, pressure, burning in the chest.  He does what he wants to do and have no difficulty doing it.  No dizziness no passing out no palpitations  Past Medical History:  Diagnosis Date  . Complete heart block (HCC)   . Nonischemic cardiomyopathy [facility])     Past Surgical History:  Procedure Laterality Date  . INSERT / REPLACE / REMOVE PACEMAKER     [place]  . UMBILICAL HERNIA REPAIR    . VASECTOMY      Current Medications: Current Meds  Medication Sig  . aspirin EC 81 MG tablet Take 1 tablet by mouth daily.   . carvedilol (COREG) 12.5 MG tablet TAKE TWO TABLETS BY MOUTH TWICE DAILY  . losartan (COZAAR) 50 MG tablet Take 1 tablet (50 mg total) by mouth 2 (two) times daily.     Allergies:   Patient has no known allergies.   Social History   Socioeconomic History  . Marital status: Married    Spouse name: Not on file  . Number of children: Not on file  . Years of education: Not on file  . Highest education level: Not on file  Occupational History  . Not on file  Tobacco Use  . Smoking status: Never Smoker  . Smokeless tobacco: Never Used  Substance and Sexual Activity  . Alcohol use: Not Currently  . Drug use: Never  . Sexual activity: Not on file  Other Topics Concern  . Not on file  Social  History Narrative  . Not on file   Social Determinants of Health   Financial Resource Strain:   . Difficulty of Paying Living Expenses: Not on file  Food Insecurity:   . Worried About Programme researcher, broadcasting/film/video in the Last Year: Not on file  . Ran Out of Food in the Last Year: Not on file  Transportation Needs:   . Lack of Transportation (Medical): Not on file  . Lack of Transportation (Non-Medical): Not on file  Physical Activity:   . Days of Exercise per Week: Not on file  . Minutes of Exercise per Session: Not on file  Stress:   . Feeling of Stress : Not on file  Social Connections:   . Frequency of Communication with Friends and Family: Not on file  . Frequency of Social Gatherings with Friends and Family: Not on file  . Attends Religious Services: Not on file  . Active Member of Clubs or Organizations: Not on file  . Attends Banker Meetings: Not on file  . Marital Status: Not on file     Family History: The patient's family history includes Anxiety disorder in his mother; Hypertension in his mother; Parkinson's disease in his father; Thyroid disease in his mother. ROS:   Please  see the history of present illness.    All 14 point review of systems negative except as described per history of present illness  EKGs/Labs/Other Studies Reviewed:      Recent Labs: 05/19/2019: BUN 17; Creatinine, Ser 1.00; Potassium 4.6; Sodium 142  Recent Lipid Panel No results found for: CHOL, TRIG, HDL, CHOLHDL, VLDL, LDLCALC, LDLDIRECT  Physical Exam:    VS:  BP 118/62   Pulse 64   Ht 5' 8.5" (1.74 m)   Wt 223 lb (101.2 kg)   SpO2 96%   BMI 33.41 kg/m     Wt Readings from Last 3 Encounters:  08/17/19 223 lb (101.2 kg)  05/07/19 218 lb 3.2 oz (99 kg)  03/20/19 216 lb (98 kg)     GEN:  Well nourished, well developed in no acute distress HEENT: Normal NECK: No JVD; No carotid bruits LYMPHATICS: No lymphadenopathy CARDIAC: RRR, no murmurs, no rubs, no  gallops RESPIRATORY:  Clear to auscultation without rales, wheezing or rhonchi  ABDOMEN: Soft, non-tender, non-distended MUSCULOSKELETAL:  No edema; No deformity  SKIN: Warm and dry LOWER EXTREMITIES: no swelling NEUROLOGIC:  Alert and oriented x 3 PSYCHIATRIC:  Normal affect   ASSESSMENT:    1. Essential hypertension   2. Dilated cardiomyopathy ([place])   3. Pacemaker    PLAN:    In order of problems listed above:  1. Essential hypertension blood pressure well controlled continue present management. 2. Dilated cardiomyopathy with ejection fraction 4045%.  We will switch him from losartan to [facility] hopefully he will be able to tolerate this medication we will try to ramp it up 3. Pacemaker present with excessive noise on the ventricular channel however he use this lead only 1% of time therefore there is no clear-cut indication for upgrading his system or changing to ICD.  We will continue monitoring.   Medication Adjustments/Labs and Tests Ordered: Current medicines are reviewed at length with the patient today.  Concerns regarding medicines are outlined above.  No orders of the defined types were placed in this encounter.  Medication changes: No orders of the defined types were placed in this encounter.   Signed, Park Liter, MD, [facility] 08/17/2019 4:11 PM    [place]

## 2019-08-17 NOTE — Patient Instructions (Signed)
Medication Instructions:  Your physician has recommended you make the following change in your medication:   STOP: Losartan   START: Entresto 24/26 mg twice daily   *If you need a refill on your cardiac medications before your next appointment, please call your pharmacy*  Lab Work: None.  If you have labs (blood work) drawn today and your tests are completely normal, you will receive your results only by: Marland Kitchen MyChart Message (if you have MyChart) OR . A paper copy in the mail If you have any lab test that is abnormal or we need to change your treatment, we will call you to review the results.  Testing/Procedures: None.   Follow-Up: At [facility], you and your health needs are our priority.  As part of our continuing mission to provide you with exceptional heart care, we have created designated Provider Care Teams.  These Care Teams include your primary Cardiologist (physician) and Advanced Practice Providers (APPs -  Physician Assistants and Nurse Practitioners) who all work together to provide you with the care you need, when you need it.  Your next appointment:   2 month(s)  The format for your next appointment:   In Person  Provider:   Jenne Campus, MD  Other Instructions  Sacubitril; Valsartan oral tablet What is this medicine? SACUBITRIL; VALSARTAN (sak UE bi tril; val SAR tan) is a combination of 2 drugs used to reduce the risk of death and hospitalizations in people with long-lasting heart failure. It is usually used with other medicines to treat heart failure. This medicine may be used for other purposes; ask your health care provider or pharmacist if you have questions. COMMON BRAND NAME(S): Entresto What should I tell my health care provider before I take this medicine? They need to know if you have any of these conditions:  diabetes and take a medicine that contains aliskiren  kidney disease  liver disease  an unusual or allergic reaction to  sacubitril; valsartan, drugs called angiotensin converting enzyme (ACE) inhibitors, angiotensin II receptor blockers (ARBs), other medicines, foods, dyes, or preservatives  pregnant or trying to get pregnant  breast-feeding How should I use this medicine? Take this medicine by mouth with a glass of water. Follow the directions on the prescription label. You can take it with or without food. If it upsets your stomach, take it with food. Take your medicine at regular intervals. Do not take it more often than directed. Do not stop taking except on your doctor's advice. Do not take this medicine for at least 36 hours before or after you take an ACE inhibitor medicine. Talk to your health care provider if you are not sure if you take an ACE inhibitor. Talk to your pediatrician regarding the use of this medicine in children. Special care may be needed. Overdosage: If you think you have taken too much of this medicine contact a poison control center or emergency room at once. NOTE: This medicine is only for you. Do not share this medicine with others. What if I miss a dose? If you miss a dose, take it as soon as you can. If it is almost time for next dose, take only that dose. Do not take double or extra doses. What may interact with this medicine? Do not take this medicine with any of the following medicines:  aliskiren if you have diabetes  angiotensin-converting enzyme (ACE) inhibitors, like benazepril, captopril, enalapril, fosinopril, lisinopril, or ramipril This medicine may also interact with the following medicines:  angiotensin  II receptor blockers (ARBs) like azilsartan, candesartan, eprosartan, irbesartan, losartan, olmesartan, telmisartan, or valsartan  lithium  NSAIDS, medicines for pain and inflammation, like ibuprofen or naproxen  potassium-sparing diuretics like amiloride, spironolactone, and triamterene  potassium supplements This list may not describe all possible  interactions. Give your health care provider a list of all the medicines, herbs, non-prescription drugs, or dietary supplements you use. Also tell them if you smoke, drink alcohol, or use illegal drugs. Some items may interact with your medicine. What should I watch for while using this medicine? Tell your doctor or healthcare professional if your symptoms do not start to get better or if they get worse. Do not become pregnant while taking this medicine. Women should inform their doctor if they wish to become pregnant or think they might be pregnant. There is a potential for serious side effects to an unborn child. Talk to your health care professional or pharmacist for more information. You may get dizzy. Do not drive, use machinery, or do anything that needs mental alertness until you know how this medicine affects you. Do not stand or sit up quickly, especially if you are an older patient. This reduces the risk of dizzy or fainting spells. Avoid alcoholic drinks; they can make you more dizzy. What side effects may I notice from receiving this medicine? Side effects that you should report to your doctor or health care professional as soon as possible:  allergic reactions like skin rash, itching or hives, swelling of the face, lips, or tongue  signs and symptoms of increased potassium like muscle weakness; chest pain; or fast, irregular heartbeat  signs and symptoms of kidney injury like trouble passing urine or change in the amount of urine  signs and symptoms of low blood pressure like feeling dizzy or lightheaded, or if you develop extreme fatigue Side effects that usually do not require medical attention (report to your doctor or health care professional if they continue or are bothersome):  cough This list may not describe all possible side effects. Call your doctor for medical advice about side effects. You may report side effects to FDA at 1-800-FDA-1088. Where should I keep my  medicine? Keep out of the reach of children. Store at room temperature between 15 and 30 degrees C (59 and 86 degrees F). Throw away any unused medicine after the expiration date. NOTE: This sheet is a summary. It may not cover all possible information. If you have questions about this medicine, talk to your doctor, pharmacist, or health care provider.  2020 Elsevier/Gold Standard (2015-10-05 13:54:19)

## 2019-08-17 NOTE — Addendum Note (Signed)
Addended by: Ashok Norris on: 08/17/2019 04:24 PM   Modules accepted: Orders

## 2019-08-18 ENCOUNTER — Telehealth: Payer: Self-pay | Admitting: Cardiology

## 2019-08-18 NOTE — Telephone Encounter (Signed)
Called and spoke to patients wife per dpr. Advised her of patient assistance number for entresto, she will check on this and see if they can qualify for it. If not she will call back.

## 2019-08-18 NOTE — Telephone Encounter (Signed)
Please call patient due to the fact that she states that the new med is unaffordable.Seth Schneider

## 2019-10-06 ENCOUNTER — Ambulatory Visit (INDEPENDENT_AMBULATORY_CARE_PROVIDER_SITE_OTHER): Payer: BC Managed Care – PPO | Admitting: *Deleted

## 2019-10-06 DIAGNOSIS — I447 Left bundle-branch block, unspecified: Secondary | ICD-10-CM

## 2019-10-07 ENCOUNTER — Telehealth: Payer: Self-pay

## 2019-10-07 NOTE — Telephone Encounter (Signed)
I tried to help the pt to send a transmission with his monitor but was unsuccessful. I gave him the number to [facility] tech support.

## 2019-10-08 LAB — CUP PACEART REMOTE DEVICE CHECK
Date Time Interrogation Session: 20210204065607
Implantable Lead Implant Date: 20031130190000
Implantable Lead Implant Date: 20031130190000
Implantable Lead Location: 753859
Implantable Lead Location: 753860
Implantable Lead Model: 4470
Implantable Lead Model: 5076
Implantable Lead Serial Number: 371531
Implantable Pulse Generator Implant Date: 20170628200000
Pulse Gen Serial Number: 755468

## 2019-10-08 NOTE — Progress Notes (Signed)
PPM Remote  

## 2019-10-20 ENCOUNTER — Encounter: Payer: Self-pay | Admitting: Cardiology

## 2019-10-20 ENCOUNTER — Other Ambulatory Visit: Payer: Self-pay

## 2019-10-20 ENCOUNTER — Ambulatory Visit (INDEPENDENT_AMBULATORY_CARE_PROVIDER_SITE_OTHER): Payer: BC Managed Care – PPO | Admitting: Cardiology

## 2019-10-20 VITALS — BP 120/78 | HR 80 | Ht 68.5 in | Wt 227.0 lb

## 2019-10-20 DIAGNOSIS — I447 Left bundle-branch block, unspecified: Secondary | ICD-10-CM

## 2019-10-20 DIAGNOSIS — I10 Essential (primary) hypertension: Secondary | ICD-10-CM

## 2019-10-20 DIAGNOSIS — E785 Hyperlipidemia, unspecified: Secondary | ICD-10-CM | POA: Diagnosis not present

## 2019-10-20 DIAGNOSIS — I495 Sick sinus syndrome: Secondary | ICD-10-CM | POA: Diagnosis not present

## 2019-10-20 NOTE — Patient Instructions (Signed)
Medication Instructions:  Your physician recommends that you continue on your current medications as directed. Please refer to the Current Medication list given to you today.  *If you need a refill on your cardiac medications before your next appointment, please call your pharmacy*  Lab Work: Your physician recommends that you return for lab work today: bmp, lipids  If you have labs (blood work) drawn today and your tests are completely normal, you will receive your results only by: Marland Kitchen MyChart Message (if you have MyChart) OR . A paper copy in the mail If you have any lab test that is abnormal or we need to change your treatment, we will call you to review the results.  Testing/Procedures: None.   Follow-Up: At [facility], you and your health needs are our priority.  As part of our continuing mission to provide you with exceptional heart care, we have created designated Provider Care Teams.  These Care Teams include your primary Cardiologist (physician) and Advanced Practice Providers (APPs -  Physician Assistants and Nurse Practitioners) who all work together to provide you with the care you need, when you need it.  Your next appointment:   3 month(s)  The format for your next appointment:   In Person  Provider:   Gypsy Balsam, MD  Other Instructions

## 2019-10-20 NOTE — Progress Notes (Signed)
Cardiology Office Note:    Date:  10/20/2019   ID:  Seth Schneider, DOB 1956/02/20, MRN [id number]  PCP:  Algis Greenhouse, MD  Cardiologist:  Jenne Campus, MD    Referring MD: Algis Greenhouse, MD   No chief complaint on file. Doing well  History of Present Illness:    Seth Schneider is a 64 y.o. male   past medical history significant for dual-chamber pacemaker, excessive noise on the ventricular lead but he was only used at least 1% of time, cardiomyopathy with latest ejection fraction 40 to 45% previously much worse, complete heart block.  He comes today to my office for follow-up overall doing great.  Denies have any chest pain, tightness, pressure, burning in the chest.  He does what he wants to do and have no difficulty doing it.  No dizziness no passing out no palpitations. Complaining of having work a lot but overall doing well.  No cardiac complaints.  Past Medical History:  Diagnosis Date  . Complete heart block ([place])   . Nonischemic cardiomyopathy [facility])     Past Surgical History:  Procedure Laterality Date  . INSERT / REPLACE / REMOVE PACEMAKER     [place]  . UMBILICAL HERNIA REPAIR    . VASECTOMY      Current Medications: Current Meds  Medication Sig  . aspirin EC 81 MG tablet Take 1 tablet by mouth daily.   . carvedilol (COREG) 12.5 MG tablet TAKE TWO TABLETS BY MOUTH TWICE DAILY  . sacubitril-valsartan (ENTRESTO) 24-26 MG Take 1 tablet by mouth 2 (two) times daily.     Allergies:   Patient has no known allergies.   Social History   Socioeconomic History  . Marital status: Married    Spouse name: Not on file  . Number of children: Not on file  . Years of education: Not on file  . Highest education level: Not on file  Occupational History  . Not on file  Tobacco Use  . Smoking status: Never Smoker  . Smokeless tobacco: Never Used  Substance and Sexual Activity  . Alcohol use: Not Currently  . Drug use: Never  . Sexual activity: Not on file    Other Topics Concern  . Not on file  Social History Narrative  . Not on file   Social Determinants of Health   Financial Resource Strain:   . Difficulty of Paying Living Expenses: Not on file  Food Insecurity:   . Worried About Charity fundraiser in the Last Year: Not on file  . Ran Out of Food in the Last Year: Not on file  Transportation Needs:   . Lack of Transportation (Medical): Not on file  . Lack of Transportation (Non-Medical): Not on file  Physical Activity:   . Days of Exercise per Week: Not on file  . Minutes of Exercise per Session: Not on file  Stress:   . Feeling of Stress : Not on file  Social Connections:   . Frequency of Communication with Friends and Family: Not on file  . Frequency of Social Gatherings with Friends and Family: Not on file  . Attends Religious Services: Not on file  . Active Member of Clubs or Organizations: Not on file  . Attends Archivist Meetings: Not on file  . Marital Status: Not on file     Family History: The patient's family history includes Anxiety disorder in his mother; Hypertension in his mother; Parkinson's disease in his father; Thyroid  disease in his mother. ROS:   Please see the history of present illness.    All 14 point review of systems negative except as described per history of present illness  EKGs/Labs/Other Studies Reviewed:      Recent Labs: 05/19/2019: BUN 17; Creatinine, Ser 1.00; Potassium 4.6; Sodium 142  Recent Lipid Panel No results found for: CHOL, TRIG, HDL, CHOLHDL, VLDL, LDLCALC, LDLDIRECT  Physical Exam:    VS:  BP 120/78   Pulse 80   Ht 5' 8.5" (1.74 m)   Wt 227 lb (103 kg)   SpO2 95%   BMI 34.01 kg/m     Wt Readings from Last 3 Encounters:  10/20/19 227 lb (103 kg)  08/17/19 223 lb (101.2 kg)  05/07/19 218 lb 3.2 oz (99 kg)     GEN:  Well nourished, well developed in no acute distress HEENT: Normal NECK: No JVD; No carotid bruits LYMPHATICS: No lymphadenopathy CARDIAC:  RRR, no murmurs, no rubs, no gallops RESPIRATORY:  Clear to auscultation without rales, wheezing or rhonchi  ABDOMEN: Soft, non-tender, non-distended MUSCULOSKELETAL:  No edema; No deformity  SKIN: Warm and dry LOWER EXTREMITIES: no swelling NEUROLOGIC:  Alert and oriented x 3 PSYCHIATRIC:  Normal affect   ASSESSMENT:    1. LBBB (left bundle branch block)   2. Essential hypertension   3. SSS (sick sinus syndrome) (HCC)   4. Dyslipidemia    PLAN:    In order of problems listed above:  1. Left bundle branch block.  Stable I will repeat echocardiogram in December of next year.  Overall, dynamically stable and clinically stable as well. 2. Essential hypertension blood pressure controlled we will continue present management I will ask him to have Chem-7 done if Chem-7 is fine will double the dose of Entresto. 3. Cardiomyopathy ejection fraction 45 to 5%.  Will try to increase Entresto. Sick sinus syndrome that being addressed with a pacemaker, last interrogation reviewed normal function except problem with the ventricular lead but he does not use it much.  There is some recording of multiple episodes nonsustained ventricular tachycardia.  That being work-up by i EP team.  Medication Adjustments/Labs and Tests Ordered: Current medicines are reviewed at length with the patient today.  Concerns regarding medicines are outlined above.  No orders of the defined types were placed in this encounter.  Medication changes: No orders of the defined types were placed in this encounter.   Signed, Georgeanna Lea, MD, [facility] 10/20/2019 4:12 PM    [facility]

## 2019-10-26 LAB — BASIC METABOLIC PANEL
BUN/Creatinine Ratio: 13 (ref 10–24)
BUN: 15 mg/dL (ref 8–27)
CO2: 20 mmol/L (ref 20–29)
Calcium: 9.9 mg/dL (ref 8.6–10.2)
Chloride: 106 mmol/L (ref 96–106)
Creatinine, Ser: 1.14 mg/dL (ref 0.76–1.27)
GFR calc Af Amer: 80 mL/min/{1.73_m2} (ref >59)
GFR calc non Af Amer: 69 mL/min/{1.73_m2} (ref >59)
Glucose: 115 mg/dL — ABNORMAL HIGH (ref 65–99)
Potassium: 4.9 mmol/L (ref 3.5–5.2)
Sodium: 141 mmol/L (ref 134–144)

## 2019-10-26 LAB — LIPID PANEL
Chol/HDL Ratio: 6.3 ratio — ABNORMAL HIGH (ref 0.0–5.0)
Cholesterol, Total: 251 mg/dL — ABNORMAL HIGH (ref 100–199)
HDL: 40 mg/dL (ref >39)
LDL Chol Calc (NIH): 152 mg/dL — ABNORMAL HIGH (ref 0–99)
Triglycerides: 316 mg/dL — ABNORMAL HIGH (ref 0–149)
VLDL Cholesterol Cal: 59 mg/dL — ABNORMAL HIGH (ref 5–40)

## 2019-10-30 ENCOUNTER — Telehealth: Payer: Self-pay

## 2019-10-30 DIAGNOSIS — I10 Essential (primary) hypertension: Secondary | ICD-10-CM

## 2019-10-30 DIAGNOSIS — Z79899 Other long term (current) drug therapy: Secondary | ICD-10-CM

## 2019-10-30 DIAGNOSIS — I420 Dilated cardiomyopathy: Secondary | ICD-10-CM

## 2019-10-30 DIAGNOSIS — I428 Other cardiomyopathies: Secondary | ICD-10-CM

## 2019-10-30 DIAGNOSIS — E785 Hyperlipidemia, unspecified: Secondary | ICD-10-CM

## 2019-10-30 DIAGNOSIS — I429 Cardiomyopathy, unspecified: Secondary | ICD-10-CM

## 2019-10-30 MED ORDER — ENTRESTO 49-51 MG PO TABS: 1.0000 | ORAL_TABLET | Freq: Two times a day (BID) | ORAL | 3 refills | Status: DC

## 2019-10-30 MED ORDER — ATORVASTATIN CALCIUM 10 MG PO TABS: 10.0000 mg | ORAL_TABLET | Freq: Every day | ORAL | 3 refills | Status: DC

## 2019-10-30 NOTE — Telephone Encounter (Signed)
Pts wife on DPR, [name], verbalized understanding of his lab results. He will increase his Sherryll Burger but he is declining taking Lipitor... I strongly urged him to think about it based on his Lipid panel results and will send in to the pharmacy to profile so it is there and ready to fill if he decides to take it but to call us and let us know.

## 2019-10-30 NOTE — Telephone Encounter (Signed)
-----   Message from Georgeanna Lea, MD sent at 10/28/2019 10:09 PM EST ----- Please double Sherryll Burger, Chem-7 next week, his cholesterol is very elevated, need to start Lipitor 10 mg daily

## 2019-11-20 ENCOUNTER — Telehealth: Payer: Self-pay | Admitting: Cardiology

## 2019-11-20 NOTE — Telephone Encounter (Signed)
Called patient and informed him per pharmacy that this wouldn't normally be a common side effect and it would be best he stayed on it if he could. He verbally understood and was ok to stay on entresto for now. He will let us know if symptoms persist, change or get worse. No further questions.

## 2019-11-20 NOTE — Telephone Encounter (Signed)
Itching is not commonly see with Entresto (reported incidence < 1%). He was changed from losartan to [facility] 08/17/19 - did he have itching on losartan as well? Could consider change back to ARB therapy, although ideally prefer pt to continue on Entresto if able due to better data in HF patients.

## 2019-11-20 NOTE — Telephone Encounter (Signed)
New Message  Pt c/o medication issue:  1. Name of Medication: sacubitril-valsartan (ENTRESTO) 49-51 MG  2. How are you currently taking this medication (dosage and times per day)? As directed  3. Are you having a reaction (difficulty breathing--STAT)? Yes, itching on back  4. What is your medication issue? Patient's wife is calling in to speak with a nurse about the medication that patient is taking. States that it is causing his back to itch. Please give patient/patient's wife a call back.

## 2019-11-20 NOTE — Telephone Encounter (Signed)
I spoke with patient's wife. She reports patient complains of itching in middle back and shoulders.  This began after Sherryll Burger was started and has gotten worse since [place] increased. No rash but area is reddened due to frequent scratching. No other recent changes. No change in soap or detergent. I told patient's wife I would send message to pharmacist to see if this could be related to [facility]. Patient is also due for follow up lab work after [employer] increased and wife will have patient stop by office for lab work.

## 2019-11-20 NOTE — Telephone Encounter (Signed)
I spoke with patient and gave him information from pharmacist. He did not have itching on losartan. He states it is not all the time and is tolerable.  He will continue Entresto and let us know if itching worsens.

## 2019-11-24 LAB — BASIC METABOLIC PANEL
BUN/Creatinine Ratio: 14 (ref 10–24)
BUN: 17 mg/dL (ref 8–27)
CO2: 21 mmol/L (ref 20–29)
Calcium: 9.4 mg/dL (ref 8.6–10.2)
Chloride: 106 mmol/L (ref 96–106)
Creatinine, Ser: 1.21 mg/dL (ref 0.76–1.27)
GFR calc Af Amer: 74 mL/min/{1.73_m2} (ref >59)
GFR calc non Af Amer: 64 mL/min/{1.73_m2} (ref >59)
Glucose: 82 mg/dL (ref 65–99)
Potassium: 4.7 mmol/L (ref 3.5–5.2)
Sodium: 140 mmol/L (ref 134–144)

## 2019-11-25 ENCOUNTER — Encounter: Payer: Self-pay | Admitting: *Deleted

## 2019-12-15 ENCOUNTER — Other Ambulatory Visit: Payer: Self-pay | Admitting: Cardiology

## 2020-01-05 ENCOUNTER — Ambulatory Visit (INDEPENDENT_AMBULATORY_CARE_PROVIDER_SITE_OTHER): Payer: BC Managed Care – PPO | Admitting: *Deleted

## 2020-01-05 DIAGNOSIS — I447 Left bundle-branch block, unspecified: Secondary | ICD-10-CM | POA: Diagnosis not present

## 2020-01-05 DIAGNOSIS — I442 Atrioventricular block, complete: Secondary | ICD-10-CM

## 2020-01-05 LAB — CUP PACEART REMOTE DEVICE CHECK
Battery Remaining Longevity: 132 mo
Battery Remaining Percentage: 100 %
Brady Statistic RA Percent Paced: 23 %
Brady Statistic RV Percent Paced: 0 %
Date Time Interrogation Session: 20210504020300
Implantable Lead Implant Date: 20031201
Implantable Lead Implant Date: 20031201
Implantable Lead Location: 753859
Implantable Lead Location: 753860
Implantable Lead Model: 4470
Implantable Lead Model: 5076
Implantable Lead Serial Number: 371531
Implantable Pulse Generator Implant Date: 20170629
Lead Channel Impedance Value: 382 Ohm
Lead Channel Impedance Value: 419 Ohm
Lead Channel Pacing Threshold Amplitude: 0.5 V
Lead Channel Pacing Threshold Amplitude: 1 V
Lead Channel Pacing Threshold Pulse Width: 0.4 ms
Lead Channel Pacing Threshold Pulse Width: 0.4 ms
Lead Channel Setting Pacing Amplitude: 2 V
Lead Channel Setting Pacing Amplitude: 2.5 V
Lead Channel Setting Pacing Pulse Width: 0.4 ms
Lead Channel Setting Sensing Sensitivity: 3.5 mV
Pulse Gen Serial Number: 755468

## 2020-01-06 NOTE — Progress Notes (Signed)
Remote pacemaker transmission.   

## 2020-01-20 ENCOUNTER — Telehealth: Payer: Self-pay | Admitting: Cardiology

## 2020-01-20 NOTE — Telephone Encounter (Signed)
Pt c/o BP issue: STAT if pt c/o blurred vision, one-sided weakness or slurred speech  1. What are your last 5 BP readings?  Monday 99/59. 103/60 and 112/49 Yesterday it ranged from 115/61 to 104/57  2. Are you having any other symptoms (ex. Dizziness, headache, blurred vision, passed out)? Monday he was lightheaded and dizzy- Yesterday it was fine  3. What is your BP issue? Pt's blood pressure is going up and down

## 2020-01-21 NOTE — Telephone Encounter (Signed)
Spoke to the patients wife just now and let her know Dr. Charm Rings recommendations. She verbalizes understanding and does not have any other issues or concerns at this time.    Encouraged for the patient to call back with any questions or concerns.

## 2020-01-21 NOTE — Telephone Encounter (Signed)
If he has some ongoing neurological symptoms he need to go to the emergency room, otherwise I will see him as scheduled.  I will not change any of his medication right now.

## 2020-01-28 ENCOUNTER — Encounter: Payer: Self-pay | Admitting: Cardiology

## 2020-01-28 ENCOUNTER — Other Ambulatory Visit: Payer: Self-pay

## 2020-01-28 ENCOUNTER — Ambulatory Visit: Payer: BC Managed Care – PPO | Admitting: Cardiology

## 2020-01-28 VITALS — BP 120/68 | HR 74 | Ht 68.5 in | Wt 213.8 lb

## 2020-01-28 DIAGNOSIS — I10 Essential (primary) hypertension: Secondary | ICD-10-CM

## 2020-01-28 DIAGNOSIS — I495 Sick sinus syndrome: Secondary | ICD-10-CM | POA: Diagnosis not present

## 2020-01-28 DIAGNOSIS — I420 Dilated cardiomyopathy: Secondary | ICD-10-CM

## 2020-01-28 DIAGNOSIS — E782 Mixed hyperlipidemia: Secondary | ICD-10-CM

## 2020-01-28 DIAGNOSIS — I447 Left bundle-branch block, unspecified: Secondary | ICD-10-CM | POA: Diagnosis not present

## 2020-01-28 NOTE — Progress Notes (Signed)
Cardiology Office Note:    Date:  01/28/2020   ID:  Seth Schneider, DOB [birth year], MRN [id number]  PCP:  Algis Greenhouse, MD  Cardiologist:  Jenne Campus, MD    Referring MD: Algis Greenhouse, MD   Chief Complaint  Patient presents with  . Follow-up    3 MO FU   Doing well  History of Present Illness:    Seth Schneider is a 64 y.o. male with past medical history significant for left bundle branch block, history of diminished left ventricle ejection fraction, however latest echocardiogram showed ejection fraction of 40 to 45%.  He is on Entresto as well as carvedilol which I will continue.  He also got pacemaker which is dual-chamber device.  Doing well still working very hard have no difficulty doing it.  No dizziness no passing out no chest pain  Past Medical History:  Diagnosis Date  . Complete heart block ([place])   . Nonischemic cardiomyopathy [facility])     Past Surgical History:  Procedure Laterality Date  . INSERT / REPLACE / REMOVE PACEMAKER     [place]  . UMBILICAL HERNIA REPAIR    . VASECTOMY      Current Medications: Current Meds  Medication Sig  . aspirin EC 81 MG tablet Take 1 tablet by mouth daily.   Marland Kitchen atorvastatin (LIPITOR) 10 MG tablet Take 1 tablet (10 mg total) by mouth daily. ** PT IS DECIDING IF HE WANTS TO TAKE IT OR NOT 10/30/19.Marland Kitchen WILL CALL IF HE FILLS RX  . carvedilol (COREG) 12.5 MG tablet TAKE TWO TABLETS BY MOUTH TWICE DAILY  . sacubitril-valsartan (ENTRESTO) 49-51 MG Take 1 tablet by mouth 2 (two) times daily.     Allergies:   Patient has no known allergies.   Social History   Socioeconomic History  . Marital status: Married    Spouse name: Not on file  . Number of children: Not on file  . Years of education: Not on file  . Highest education level: Not on file  Occupational History  . Not on file  Tobacco Use  . Smoking status: Never Smoker  . Smokeless tobacco: Never Used  Substance and Sexual Activity  . Alcohol use: Not Currently    . Drug use: Never  . Sexual activity: Not on file  Other Topics Concern  . Not on file  Social History Narrative  . Not on file   Social Determinants of Health   Financial Resource Strain:   . Difficulty of Paying Living Expenses:   Food Insecurity:   . Worried About Charity fundraiser in the Last Year:   . Arboriculturist in the Last Year:   Transportation Needs:   . Film/video editor (Medical):   Marland Kitchen Lack of Transportation (Non-Medical):   Physical Activity:   . Days of Exercise per Week:   . Minutes of Exercise per Session:   Stress:   . Feeling of Stress :   Social Connections:   . Frequency of Communication with Friends and Family:   . Frequency of Social Gatherings with Friends and Family:   . Attends Religious Services:   . Active Member of Clubs or Organizations:   . Attends Archivist Meetings:   Marland Kitchen Marital Status:      Family History: The patient's family history includes Anxiety disorder in his mother; Hypertension in his mother; Parkinson's disease in his father; Thyroid disease in his mother. ROS:   Please see the  history of present illness.    All 14 point review of systems negative except as described per history of present illness  EKGs/Labs/Other Studies Reviewed:      Recent Labs: 11/23/2019: BUN 17; Creatinine, Ser 1.21; Potassium 4.7; Sodium 140  Recent Lipid Panel    Component Value Date/Time   CHOL 251 (H) 10/26/2019 0935   TRIG 316 (H) 10/26/2019 0935   HDL 40 10/26/2019 0935   CHOLHDL 6.3 (H) 10/26/2019 0935   LDLCALC 152 (H) 10/26/2019 0935    Physical Exam:    VS:  BP 120/68   Pulse 74   Ht 5' 8.5" (1.74 m)   Wt 213 lb 12.8 oz (97 kg)   SpO2 97%   BMI 32.04 kg/m     Wt Readings from Last 3 Encounters:  01/28/20 213 lb 12.8 oz (97 kg)  10/20/19 227 lb (103 kg)  08/17/19 223 lb (101.2 kg)     GEN:  Well nourished, well developed in no acute distress HEENT: Normal NECK: No JVD; No carotid bruits LYMPHATICS:  No lymphadenopathy CARDIAC: RRR, no murmurs, no rubs, no gallops RESPIRATORY:  Clear to auscultation without rales, wheezing or rhonchi  ABDOMEN: Soft, non-tender, non-distended MUSCULOSKELETAL:  No edema; No deformity  SKIN: Warm and dry LOWER EXTREMITIES: no swelling NEUROLOGIC:  Alert and oriented x 3 PSYCHIATRIC:  Normal affect   ASSESSMENT:    1. LBBB (left bundle branch block)   2. SSS (sick sinus syndrome) (HCC)   3. Essential hypertension   4. Dilated cardiomyopathy (HCC)   5. Mixed hyperlipidemia    PLAN:    In order of problems listed above:  1. Left bundle branch of present: I will schedule him to have echocardiogram to reassess left ventricle ejection fraction.  Continue Entresto as well as carvedilol 2. Sick sinus syndrome: Doing well from that point review pacemaker is present.  He does have excessive nausea ventricle lead.  But use the sling very rarely. 3. Essential hypertension blood pressure perfectly controlled. 4. Dilated cardiomyopathy plan as outlined above echocardiogram will be repeated 5. Mixed dyslipidemia I wanted to check his fasting lipid profile he declined he does not want to do it he said regardless he is now going to take any medications for it.  I did review his K PN from February 2021 which showed HDL of 40 LDL was not available.   Medication Adjustments/Labs and Tests Ordered: Current medicines are reviewed at length with the patient today.  Concerns regarding medicines are outlined above.  No orders of the defined types were placed in this encounter.  Medication changes: No orders of the defined types were placed in this encounter.   Signed, Georgeanna Lea, MD, [facility] 01/28/2020 3:47 PM    [facility]

## 2020-01-28 NOTE — Patient Instructions (Signed)
Medication Instructions:  °Your physician recommends that you continue on your current medications as directed. Please refer to the Current Medication list given to you today. ° °*If you need a refill on your cardiac medications before your next appointment, please call your pharmacy* ° ° °Lab Work: °None. ° °If you have labs (blood work) drawn today and your tests are completely normal, you will receive your results only by: °• MyChart Message (if you have MyChart) OR °• A paper copy in the mail °If you have any lab test that is abnormal or we need to change your treatment, we will call you to review the results. ° ° °Testing/Procedures: °Your physician has requested that you have an echocardiogram. Echocardiography is a painless test that uses sound waves to create images of your heart. It provides your doctor with information about the size and shape of your heart and how well your heart’s chambers and valves are working. This procedure takes approximately one hour. There are no restrictions for this procedure. ° ° ° ° °Follow-Up: °At [facility], you and your health needs are our priority.  As part of our continuing mission to provide you with exceptional heart care, we have created designated Provider Care Teams.  These Care Teams include your primary Cardiologist (physician) and Advanced Practice Providers (APPs -  Physician Assistants and Nurse Practitioners) who all work together to provide you with the care you need, when you need it. ° °We recommend signing up for the patient portal called "MyChart".  Sign up information is provided on this After Visit Summary.  MyChart is used to connect with patients for Virtual Visits (Telemedicine).  Patients are able to view lab/test results, encounter notes, upcoming appointments, etc.  Non-urgent messages can be sent to your provider as well.   °To learn more about what you can do with MyChart, go to [email or web address].   ° °Your next appointment:   °6  month(s) ° °The format for your next appointment:   °In Person ° °Provider:   °Robert Krasowski, MD ° ° °Other Instructions ° ° °Echocardiogram °An echocardiogram is a procedure that uses painless sound waves (ultrasound) to produce an image of the heart. Images from an echocardiogram can provide important information about: °· Signs of coronary artery disease (CAD). °· Aneurysm detection. An aneurysm is a weak or damaged part of an artery wall that bulges out from the normal force of blood pumping through the body. °· Heart size and shape. Changes in the size or shape of the heart can be associated with certain conditions, including heart failure, aneurysm, and CAD. °· Heart muscle function. °· Heart valve function. °· Signs of a past heart attack. °· Fluid buildup around the heart. °· Thickening of the heart muscle. °· A tumor or infectious growth around the heart valves. °Tell a health care provider about: °· Any allergies you have. °· All medicines you are taking, including vitamins, herbs, eye drops, creams, and over-the-counter medicines. °· Any blood disorders you have. °· Any surgeries you have had. °· Any medical conditions you have. °· Whether you are pregnant or may be pregnant. °What are the risks? °Generally, this is a safe procedure. However, problems may occur, including: °· Allergic reaction to dye (contrast) that may be used during the procedure. °What happens before the procedure? °No specific preparation is needed. You may eat and drink normally. °What happens during the procedure? ° °· An IV tube may be inserted into one of your veins. °· You may   receive contrast through this tube. A contrast is an injection that improves the quality of the pictures from your heart. °· A gel will be applied to your chest. °· A wand-like tool (transducer) will be moved over your chest. The gel will help to transmit the sound waves from the transducer. °· The sound waves will harmlessly bounce off of your heart to  allow the heart images to be captured in real-time motion. The images will be recorded on a computer. °The procedure may vary among health care providers and hospitals. °What happens after the procedure? °· You may return to your normal, everyday life, including diet, activities, and medicines, unless your health care provider tells you not to do that. °Summary °· An echocardiogram is a procedure that uses painless sound waves (ultrasound) to produce an image of the heart. °· Images from an echocardiogram can provide important information about the size and shape of your heart, heart muscle function, heart valve function, and fluid buildup around your heart. °· You do not need to do anything to prepare before this procedure. You may eat and drink normally. °· After the echocardiogram is completed, you may return to your normal, everyday life, unless your health care provider tells you not to do that. °This information is not intended to replace advice given to you by your health care provider. Make sure you discuss any questions you have with your health care provider. °Document Revised: 12/11/2018 Document Reviewed: 09/22/2016 °Elsevier Patient Education © 2020 Elsevier Inc. ° ° °

## 2020-02-18 ENCOUNTER — Ambulatory Visit (INDEPENDENT_AMBULATORY_CARE_PROVIDER_SITE_OTHER): Payer: BC Managed Care – PPO

## 2020-02-18 ENCOUNTER — Other Ambulatory Visit: Payer: Self-pay

## 2020-02-18 DIAGNOSIS — I420 Dilated cardiomyopathy: Secondary | ICD-10-CM | POA: Diagnosis not present

## 2020-02-18 NOTE — Progress Notes (Signed)
Complete echocardiogram performed.  Jimmy Turki Tapanes RDCS, RVT  

## 2020-03-28 ENCOUNTER — Other Ambulatory Visit: Payer: Self-pay | Admitting: Cardiology

## 2020-03-28 NOTE — Telephone Encounter (Signed)
Rx refill sent to pharmacy. 

## 2020-04-05 ENCOUNTER — Ambulatory Visit (INDEPENDENT_AMBULATORY_CARE_PROVIDER_SITE_OTHER): Payer: BC Managed Care – PPO | Admitting: *Deleted

## 2020-04-05 DIAGNOSIS — I472 Ventricular tachycardia, unspecified: Secondary | ICD-10-CM

## 2020-04-05 LAB — CUP PACEART REMOTE DEVICE CHECK
Battery Remaining Longevity: 132 mo
Battery Remaining Percentage: 100 %
Brady Statistic RA Percent Paced: 24 %
Brady Statistic RV Percent Paced: 0 %
Date Time Interrogation Session: 20210803020100
Implantable Lead Implant Date: 20031201
Implantable Lead Implant Date: 20031201
Implantable Lead Location: 753859
Implantable Lead Location: 753860
Implantable Lead Model: 4470
Implantable Lead Model: 5076
Implantable Lead Serial Number: 371531
Implantable Pulse Generator Implant Date: 20170629
Lead Channel Impedance Value: 424 Ohm
Lead Channel Impedance Value: 434 Ohm
Lead Channel Pacing Threshold Amplitude: 0.5 V
Lead Channel Pacing Threshold Amplitude: 1 V
Lead Channel Pacing Threshold Pulse Width: 0.4 ms
Lead Channel Pacing Threshold Pulse Width: 0.4 ms
Lead Channel Setting Pacing Amplitude: 2 V
Lead Channel Setting Pacing Amplitude: 2.5 V
Lead Channel Setting Pacing Pulse Width: 0.4 ms
Lead Channel Setting Sensing Sensitivity: 3.5 mV
Pulse Gen Serial Number: 755468

## 2020-04-07 NOTE — Progress Notes (Signed)
Remote pacemaker transmission.   

## 2020-06-09 DIAGNOSIS — K5900 Constipation, unspecified: Secondary | ICD-10-CM

## 2020-06-09 HISTORY — DX: Constipation, unspecified: K59.00

## 2020-07-05 ENCOUNTER — Ambulatory Visit (INDEPENDENT_AMBULATORY_CARE_PROVIDER_SITE_OTHER): Payer: BC Managed Care – PPO

## 2020-07-05 DIAGNOSIS — I420 Dilated cardiomyopathy: Secondary | ICD-10-CM

## 2020-07-05 LAB — CUP PACEART REMOTE DEVICE CHECK
Battery Remaining Longevity: 126 mo
Battery Remaining Percentage: 100 %
Brady Statistic RA Percent Paced: 26 %
Brady Statistic RV Percent Paced: 0 %
Date Time Interrogation Session: 20211102021300
Implantable Lead Implant Date: 20031201
Implantable Lead Implant Date: 20031201
Implantable Lead Location: 753859
Implantable Lead Location: 753860
Implantable Lead Model: 4470
Implantable Lead Model: 5076
Implantable Lead Serial Number: 371531
Implantable Pulse Generator Implant Date: 20170629
Lead Channel Impedance Value: 402 Ohm
Lead Channel Impedance Value: 434 Ohm
Lead Channel Pacing Threshold Amplitude: 0.5 V
Lead Channel Pacing Threshold Amplitude: 1 V
Lead Channel Pacing Threshold Pulse Width: 0.4 ms
Lead Channel Pacing Threshold Pulse Width: 0.4 ms
Lead Channel Setting Pacing Amplitude: 2 V
Lead Channel Setting Pacing Amplitude: 2.5 V
Lead Channel Setting Pacing Pulse Width: 0.4 ms
Lead Channel Setting Sensing Sensitivity: 3.5 mV
Pulse Gen Serial Number: 755468

## 2020-07-07 NOTE — Progress Notes (Signed)
Remote pacemaker transmission.   

## 2020-07-27 DIAGNOSIS — I428 Other cardiomyopathies: Secondary | ICD-10-CM | POA: Insufficient documentation

## 2020-08-02 ENCOUNTER — Encounter: Payer: Self-pay | Admitting: Cardiology

## 2020-08-02 ENCOUNTER — Ambulatory Visit: Payer: BC Managed Care – PPO | Admitting: Cardiology

## 2020-08-02 ENCOUNTER — Other Ambulatory Visit: Payer: Self-pay

## 2020-08-02 VITALS — BP 118/78 | HR 82 | Ht 68.5 in | Wt 218.0 lb

## 2020-08-02 DIAGNOSIS — I10 Essential (primary) hypertension: Secondary | ICD-10-CM | POA: Diagnosis not present

## 2020-08-02 DIAGNOSIS — I495 Sick sinus syndrome: Secondary | ICD-10-CM

## 2020-08-02 DIAGNOSIS — I428 Other cardiomyopathies: Secondary | ICD-10-CM | POA: Diagnosis not present

## 2020-08-02 DIAGNOSIS — I447 Left bundle-branch block, unspecified: Secondary | ICD-10-CM

## 2020-08-02 DIAGNOSIS — E785 Hyperlipidemia, unspecified: Secondary | ICD-10-CM

## 2020-08-02 NOTE — Addendum Note (Signed)
Addended by: Hazle Quant on: 08/02/2020 03:30 PM   Modules accepted: Orders

## 2020-08-02 NOTE — Progress Notes (Signed)
Cardiology Office Note:    Date:  08/02/2020   ID:  Seth Schneider, DOB [birth year], MRN [id number]  PCP:  Olive Bass, MD  Cardiologist:  Gypsy Balsam, MD    Referring MD: Olive Bass, MD   Chief Complaint  Patient presents with  . Follow-up  I am doing fine  History of Present Illness:    Seth Schneider is a 64 y.o. male with past medical history significant for left bundle branch block, mildly diminished left ventricle ejection fraction 4045%, dual-chamber pacemaker present secondary to sick sinus syndrome.  Comes today 2 months of follow-up.  Overall doing very well denies have any chest pain tightness squeezing pressure burning chest.  He retired in September he is try to exercise on the regular basis but he is moderate end up having fracture of the hip and he is taking care of her.  Denies have any cardiac complaints  Past Medical History:  Diagnosis Date  . Cardiomyopathy (HCC) 07/12/2015   Overview:  Overview:  Normalization of ejection fraction 2017 Overview:  Normalization of ejection fraction 2017  . Chronic kidney disease, stage 3 (HCC) 04/30/2018   2019: 43  . Complete heart block (HCC)   . Constipation 06/09/2020   Formatting of this note might be different from the original. 06/09/2020  . Diverticulosis of colon 04/25/2017   Formatting of this note might be different from the original. 2012: diverticulitis  . Dyslipidemia 07/12/2015  . Essential hypertension 07/12/2015  . Gout 04/29/2018   Formatting of this note might be different from the original. 2019: left first MTPJ, 8.6  . LBBB (left bundle branch block) 12/17/2018  . Mixed hyperlipidemia 04/25/2017  . Nonischemic cardiomyopathy (HCC)   . Normal coronary arteries 12/17/2018  . Pacemaker 07/12/2015  . SSS (sick sinus syndrome) (HCC) 11/01/2015  . Ventricular tachycardia (HCC) 04/25/2017    Past Surgical History:  Procedure Laterality Date  . INSERT / REPLACE / REMOVE PACEMAKER     [place]  .  UMBILICAL HERNIA REPAIR    . VASECTOMY      Current Medications: Current Meds  Medication Sig  . aspirin EC 81 MG tablet Take 1 tablet by mouth daily.   Marland Kitchen atorvastatin (LIPITOR) 10 MG tablet Take 1 tablet (10 mg total) by mouth daily. ** PT IS DECIDING IF HE WANTS TO TAKE IT OR NOT 10/30/19.Marland Kitchen WILL CALL IF HE FILLS RX  . carvedilol (COREG) 12.5 MG tablet TAKE TWO TABLETS BY MOUTH TWICE DAILY  . sacubitril-valsartan (ENTRESTO) 49-51 MG Take 1 tablet by mouth 2 (two) times daily.     Allergies:   Patient has no known allergies.   Social History   Socioeconomic History  . Marital status: Married    Spouse name: Not on file  . Number of children: Not on file  . Years of education: Not on file  . Highest education level: Not on file  Occupational History  . Not on file  Tobacco Use  . Smoking status: Never Smoker  . Smokeless tobacco: Never Used  Vaping Use  . Vaping Use: Never used  Substance and Sexual Activity  . Alcohol use: Not Currently  . Drug use: Never  . Sexual activity: Not on file  Other Topics Concern  . Not on file  Social History Narrative  . Not on file   Social Determinants of Health   Financial Resource Strain:   . Difficulty of Paying Living Expenses: Not on file  Food Insecurity:   .  Worried About Programme researcher, broadcasting/film/video in the Last Year: Not on file  . Ran Out of Food in the Last Year: Not on file  Transportation Needs:   . Lack of Transportation (Medical): Not on file  . Lack of Transportation (Non-Medical): Not on file  Physical Activity:   . Days of Exercise per Week: Not on file  . Minutes of Exercise per Session: Not on file  Stress:   . Feeling of Stress : Not on file  Social Connections:   . Frequency of Communication with Friends and Family: Not on file  . Frequency of Social Gatherings with Friends and Family: Not on file  . Attends Religious Services: Not on file  . Active Member of Clubs or Organizations: Not on file  . Attends Tax inspector Meetings: Not on file  . Marital Status: Not on file     Family History: The patient's family history includes Anxiety disorder in his mother; Hypertension in his mother; Parkinson's disease in his father; Thyroid disease in his mother. ROS:   Please see the history of present illness.    All 14 point review of systems negative except as described per history of present illness  EKGs/Labs/Other Studies Reviewed:      Recent Labs: 11/23/2019: BUN 17; Creatinine, Ser 1.21; Potassium 4.7; Sodium 140  Recent Lipid Panel    Component Value Date/Time   CHOL 251 (H) 10/26/2019 0935   TRIG 316 (H) 10/26/2019 0935   HDL 40 10/26/2019 0935   CHOLHDL 6.3 (H) 10/26/2019 0935   LDLCALC 152 (H) 10/26/2019 0935    Physical Exam:    VS:  BP 118/78 (BP Location: Left Arm, Patient Position: Sitting)   Pulse 82   Ht 5' 8.5" (1.74 m)   Wt 218 lb (98.9 kg)   SpO2 97%   BMI 32.66 kg/m     Wt Readings from Last 3 Encounters:  08/02/20 218 lb (98.9 kg)  01/28/20 213 lb 12.8 oz (97 kg)  10/20/19 227 lb (103 kg)     GEN:  Well nourished, well developed in no acute distress HEENT: Normal NECK: No JVD; No carotid bruits LYMPHATICS: No lymphadenopathy CARDIAC: RRR, no murmurs, no rubs, no gallops RESPIRATORY:  Clear to auscultation without rales, wheezing or rhonchi  ABDOMEN: Soft, non-tender, non-distended MUSCULOSKELETAL:  No edema; No deformity  SKIN: Warm and dry LOWER EXTREMITIES: no swelling NEUROLOGIC:  Alert and oriented x 3 PSYCHIATRIC:  Normal affect   ASSESSMENT:    1. SSS (sick sinus syndrome) (HCC)   2. Nonischemic cardiomyopathy (HCC)   3. LBBB (left bundle branch block)   4. Essential hypertension   5. Dyslipidemia    PLAN:    In order of problems listed above:  1. Sick sinus syndrome that being addressed with the device, interrogation of the device reviewed.  Plenty of battery left however there was some reading of fast ventricular rate it looks like  those were noise detection. 2. Nonischemic cardiomyopathy.  On appropriate medications which I will continue. 3. Left bundle branch block.  Noted. 4. Essential hypertension blood pressure well controlled continue present management. 5. Dyslipidemia we will check fasting lipid profile today he is on the Lipitor 10 K PN show me data from February of this year with LDL of 152 HDL 40.  Will check cholesterol today.   Medication Adjustments/Labs and Tests Ordered: Current medicines are reviewed at length with the patient today.  Concerns regarding medicines are outlined above.  No orders  of the defined types were placed in this encounter.  Medication changes: No orders of the defined types were placed in this encounter.   Signed, Georgeanna Lea, MD, [facility] 08/02/2020 3:23 PM    [facility]

## 2020-08-02 NOTE — Patient Instructions (Signed)

## 2020-08-03 LAB — LIPID PANEL
Chol/HDL Ratio: 7 ratio — ABNORMAL HIGH (ref 0.0–5.0)
Cholesterol, Total: 265 mg/dL — ABNORMAL HIGH (ref 100–199)
HDL: 38 mg/dL — ABNORMAL LOW (ref >39)
LDL Chol Calc (NIH): 162 mg/dL — ABNORMAL HIGH (ref 0–99)
Triglycerides: 344 mg/dL — ABNORMAL HIGH (ref 0–149)
VLDL Cholesterol Cal: 65 mg/dL — ABNORMAL HIGH (ref 5–40)

## 2020-08-05 ENCOUNTER — Telehealth: Payer: Self-pay | Admitting: Emergency Medicine

## 2020-08-05 DIAGNOSIS — E785 Hyperlipidemia, unspecified: Secondary | ICD-10-CM

## 2020-08-05 DIAGNOSIS — I420 Dilated cardiomyopathy: Secondary | ICD-10-CM

## 2020-08-05 DIAGNOSIS — I428 Other cardiomyopathies: Secondary | ICD-10-CM

## 2020-08-05 DIAGNOSIS — I10 Essential (primary) hypertension: Secondary | ICD-10-CM

## 2020-08-05 DIAGNOSIS — I429 Cardiomyopathy, unspecified: Secondary | ICD-10-CM

## 2020-08-05 DIAGNOSIS — Z79899 Other long term (current) drug therapy: Secondary | ICD-10-CM

## 2020-08-05 MED ORDER — ATORVASTATIN CALCIUM 20 MG PO TABS: 20.0000 mg | ORAL_TABLET | Freq: Every day | ORAL | 1 refills | Status: DC

## 2020-08-05 NOTE — Telephone Encounter (Signed)
-----   Message from Georgeanna Lea, MD sent at 08/04/2020  3:11 PM EST ----- Cholesterol still not well controlled, please double the dose of Lipitor to 20 mg daily, fasting lipid profile within the next 6 weeks with AST ALT

## 2020-08-05 NOTE — Telephone Encounter (Signed)
Called patient informed him of results. Advised him to increase lipitor to 20 mg daily and have fasting labs drawn in 6 weeks. Patient verbally understood no further questions.

## 2020-09-05 ENCOUNTER — Other Ambulatory Visit: Payer: Self-pay

## 2020-09-05 ENCOUNTER — Ambulatory Visit (INDEPENDENT_AMBULATORY_CARE_PROVIDER_SITE_OTHER): Payer: BC Managed Care – PPO | Admitting: Cardiology

## 2020-09-05 ENCOUNTER — Encounter: Payer: Self-pay | Admitting: Cardiology

## 2020-09-05 VITALS — BP 121/80 | HR 66 | Ht 68.5 in | Wt 215.0 lb

## 2020-09-05 DIAGNOSIS — I442 Atrioventricular block, complete: Secondary | ICD-10-CM

## 2020-09-05 NOTE — Progress Notes (Signed)
Electrophysiology Office Note   Date:  09/05/2020   ID:  [name], DOB 07/06/1956, MRN [id number]  PCP:  Olive Bass, MD  Cardiologist: Bing Matter Primary Electrophysiologist:  Rodneisha Bonnet Jorja Loa, MD    No chief complaint on file.    History of Present Illness: Seth Schneider is a 65 y.o. male who is being seen today for the evaluation of CHF at the request of Seth Schneider, Doris Cheadle, MD. Presenting today for electrophysiology evaluation.  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  He had an echo which showed improvement of his LV function.  Today, denies symptoms of palpitations, chest pain, shortness of breath, orthopnea, PND, lower extremity edema, claudication, dizziness, presyncope, syncope, bleeding, or neurologic sequela. The patient is tolerating medications without difficulties.     Past Medical History:  Diagnosis Date  . Cardiomyopathy (HCC) 07/12/2015   Overview:  Overview:  Normalization of ejection fraction 2017 Overview:  Normalization of ejection fraction 2017  . Chronic kidney disease, stage 3 (HCC) 04/30/2018   2019: 43  . Complete heart block (HCC)   . Constipation 06/09/2020   Formatting of this note might be different from the original. 06/09/2020  . Diverticulosis of colon 04/25/2017   Formatting of this note might be different from the original. 2012: diverticulitis  . Dyslipidemia 07/12/2015  . Essential hypertension 07/12/2015  . Gout 04/29/2018   Formatting of this note might be different from the original. 2019: left first MTPJ, 8.6  . LBBB (left bundle branch block) 12/17/2018  . Mixed hyperlipidemia 04/25/2017  . Nonischemic cardiomyopathy (HCC)   . Normal coronary arteries 12/17/2018  . Pacemaker 07/12/2015  . SSS (sick sinus syndrome) (HCC) 11/01/2015  . Ventricular tachycardia (HCC) 04/25/2017   Past Surgical History:   Procedure Laterality Date  . INSERT / REPLACE / REMOVE PACEMAKER     [place]  . UMBILICAL HERNIA REPAIR    . VASECTOMY       Current Outpatient Medications  Medication Sig Dispense Refill  . aspirin EC 81 MG tablet Take 1 tablet by mouth daily.     . carvedilol (COREG) 12.5 MG tablet TAKE TWO TABLETS BY MOUTH TWICE DAILY 360 tablet 1  . sacubitril-valsartan (ENTRESTO) 49-51 MG Take 1 tablet by mouth 2 (two) times daily. 180 tablet 3   No current facility-administered medications for this visit.    Allergies:   Patient has no known allergies.   Social History:  The patient  reports that he has never smoked. He has never used smokeless tobacco. He reports previous alcohol use. He reports that he does not use drugs.   Family History:  The patient's family history includes Anxiety disorder in his mother; Hypertension in his mother; Parkinson's disease in his father; Thyroid disease in his mother.   ROS:  Please see the history of present illness.   Otherwise, review of systems is positive for none.   All other systems are reviewed and negative.   PHYSICAL EXAM: VS:  BP 121/80   Pulse 66   Ht 5' 8.5" (1.74 m)   Wt 215 lb (97.5 kg)   BMI 32.22 kg/m  , BMI Body mass index is 32.22 kg/m. GEN: Well nourished, well developed, in no acute distress  HEENT: normal  Neck: no JVD, carotid bruits, or masses Cardiac: RRR; no murmurs, rubs, or gallops,no edema  Respiratory:  clear to auscultation  bilaterally, normal work of breathing GI: soft, nontender, nondistended, + BS MS: no deformity or atrophy  Skin: warm and dry, device site well healed Neuro:  Strength and sensation are intact Psych: euthymic mood, full affect  EKG:  EKG is not ordered today. Personal review of the ekg ordered 08/03/20 shows sinus rhythm, left bundle branch block  Personal review of the device interrogation today. Results in [facility]    Recent Labs: 11/23/2019: BUN 17; Creatinine, Ser 1.21; Potassium 4.7;  Sodium 140    Lipid Panel     Component Value Date/Time   CHOL 265 (H) 08/03/2020 0812   TRIG 344 (H) 08/03/2020 0812   HDL 38 (L) 08/03/2020 0812   CHOLHDL 7.0 (H) 08/03/2020 0812   LDLCALC 162 (H) 08/03/2020 0812     Wt Readings from Last 3 Encounters:  09/05/20 215 lb (97.5 kg)  08/02/20 218 lb (98.9 kg)  01/28/20 213 lb 12.8 oz (97 kg)      Other studies Reviewed: Additional studies/ records that were reviewed today include: TTE 03/09/19  Review of the above records today demonstrates:   1. The left ventricle has mild-moderately reduced systolic function, with an ejection fraction of 40-45%. The cavity size was normal. There is moderate concentric left ventricular hypertrophy. Left ventricular diastolic Doppler parameters are consistent  with impaired relaxation. There is abnormal septal motion with wide QRS BBB or paced rhythm. Left ventrical global hypokinesis without regional wall motion abnormalities.  2. The right ventricle has normal systolic function. The cavity was normal. There is no increase in right ventricular wall thickness.  3. No evidence of mitral valve stenosis.  4. The aortic valve is tricuspid. No stenosis of the aortic valve.  5. The aortic root, ascending aorta and aortic arch are normal in size and structure.  6. 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   ASSESSMENT AND PLAN:  1.  Complete heart block: Has excessive noise.  Echo shows improved ejection fraction to 40 to 45%.  Defibrillator is not indicated.  He paces less than 1% of the time.  We Corderro Koloski continue to monitor.  2.  Nonischemic cardiomyopathy: Fortunately ejection fraction is improved.  Continue with current management per primary cardiology.   Current medicines are reviewed at length with the patient today.   The patient does not have concerns regarding his medicines.  The following changes were made today:  none  Labs/ tests ordered today include:  No orders of  the defined types were placed in this encounter.    Disposition:   FU with Marquitta Persichetti 1 year  Signed, Brelynn Wheller Jorja Loa, MD  09/05/2020 9:49 AM     [facility] [address] [place] [address] [phone] (office) [phone] (fax)

## 2020-10-04 ENCOUNTER — Ambulatory Visit (INDEPENDENT_AMBULATORY_CARE_PROVIDER_SITE_OTHER): Payer: BC Managed Care – PPO

## 2020-10-04 DIAGNOSIS — I420 Dilated cardiomyopathy: Secondary | ICD-10-CM | POA: Diagnosis not present

## 2020-10-04 LAB — CUP PACEART REMOTE DEVICE CHECK
Battery Remaining Longevity: 126 mo
Battery Remaining Percentage: 100 %
Brady Statistic RA Percent Paced: 34 %
Brady Statistic RV Percent Paced: 0 %
Date Time Interrogation Session: 20220201020000
Implantable Lead Implant Date: 20031201
Implantable Lead Implant Date: 20031201
Implantable Lead Location: 753859
Implantable Lead Location: 753860
Implantable Lead Model: 4470
Implantable Lead Model: 5076
Implantable Lead Serial Number: 371531
Implantable Pulse Generator Implant Date: 20170629
Lead Channel Impedance Value: 427 Ohm
Lead Channel Impedance Value: 431 Ohm
Lead Channel Pacing Threshold Amplitude: 0.4 V
Lead Channel Pacing Threshold Amplitude: 1 V
Lead Channel Pacing Threshold Pulse Width: 0.4 ms
Lead Channel Pacing Threshold Pulse Width: 0.4 ms
Lead Channel Setting Pacing Amplitude: 2 V
Lead Channel Setting Pacing Amplitude: 2.5 V
Lead Channel Setting Pacing Pulse Width: 0.4 ms
Lead Channel Setting Sensing Sensitivity: 3.5 mV
Pulse Gen Serial Number: 755468

## 2020-10-12 NOTE — Progress Notes (Signed)
Remote pacemaker transmission.   

## 2020-11-14 ENCOUNTER — Other Ambulatory Visit: Payer: Self-pay | Admitting: Cardiology

## 2020-12-03 ENCOUNTER — Other Ambulatory Visit: Payer: Self-pay | Admitting: Cardiology

## 2020-12-05 NOTE — Telephone Encounter (Signed)
Entresto approved and sent 

## 2021-01-03 ENCOUNTER — Ambulatory Visit (INDEPENDENT_AMBULATORY_CARE_PROVIDER_SITE_OTHER): Payer: BC Managed Care – PPO

## 2021-01-03 DIAGNOSIS — I428 Other cardiomyopathies: Secondary | ICD-10-CM

## 2021-01-03 LAB — CUP PACEART REMOTE DEVICE CHECK
Battery Remaining Longevity: 120 mo
Battery Remaining Percentage: 100 %
Brady Statistic RA Percent Paced: 28 %
Brady Statistic RV Percent Paced: 0 %
Date Time Interrogation Session: 20220503020100
Implantable Lead Implant Date: 20031201
Implantable Lead Implant Date: 20031201
Implantable Lead Location: 753859
Implantable Lead Location: 753860
Implantable Lead Model: 4470
Implantable Lead Model: 5076
Implantable Lead Serial Number: 371531
Implantable Pulse Generator Implant Date: 20170629
Lead Channel Impedance Value: 429 Ohm
Lead Channel Impedance Value: 438 Ohm
Lead Channel Pacing Threshold Amplitude: 0.5 V
Lead Channel Pacing Threshold Amplitude: 1 V
Lead Channel Pacing Threshold Pulse Width: 0.4 ms
Lead Channel Pacing Threshold Pulse Width: 0.4 ms
Lead Channel Setting Pacing Amplitude: 2 V
Lead Channel Setting Pacing Amplitude: 2.5 V
Lead Channel Setting Pacing Pulse Width: 0.4 ms
Lead Channel Setting Sensing Sensitivity: 3.5 mV
Pulse Gen Serial Number: 755468

## 2021-01-24 ENCOUNTER — Ambulatory Visit: Payer: BC Managed Care – PPO | Admitting: Cardiology

## 2021-01-24 ENCOUNTER — Encounter: Payer: Self-pay | Admitting: Cardiology

## 2021-01-24 ENCOUNTER — Other Ambulatory Visit: Payer: Self-pay

## 2021-01-24 VITALS — BP 106/72 | HR 71 | Ht 68.0 in | Wt 223.0 lb

## 2021-01-24 DIAGNOSIS — Z950 Presence of cardiac pacemaker: Secondary | ICD-10-CM

## 2021-01-24 DIAGNOSIS — I420 Dilated cardiomyopathy: Secondary | ICD-10-CM

## 2021-01-24 DIAGNOSIS — I447 Left bundle-branch block, unspecified: Secondary | ICD-10-CM | POA: Diagnosis not present

## 2021-01-24 DIAGNOSIS — E782 Mixed hyperlipidemia: Secondary | ICD-10-CM | POA: Diagnosis not present

## 2021-01-24 NOTE — Patient Instructions (Signed)
Medication Instructions:  Your physician recommends that you continue on your current medications as directed. Please refer to the Current Medication list given to you today.  *If you need a refill on your cardiac medications before your next appointment, please call your pharmacy*   Lab Work: none If you have labs (blood work) drawn today and your tests are completely normal, you will receive your results only by: Marland Kitchen MyChart Message (if you have MyChart) OR . A paper copy in the mail If you have any lab test that is abnormal or we need to change your treatment, we will call you to review the results.   Testing/Procedures: Your physician has requested that you have an echocardiogram. Echocardiography is a painless test that uses sound waves to create images of your heart. It provides your doctor with information about the size and shape of your heart and how well your heart's chambers and valves are working. This procedure takes approximately one hour. There are no restrictions for this procedure.    Non-Cardiac CT scanning, (CAT scanning), is a noninvasive, special x-ray that produces cross-sectional images of the body using x-rays and a computer. CT scans help physicians diagnose and treat medical conditions. For some CT exams, a contrast material is used to enhance visibility in the area of the body being studied. CT scans provide greater clarity and reveal more details than regular x-ray exams.   Follow-Up: At [facility], you and your health needs are our priority.  As part of our continuing mission to provide you with exceptional heart care, we have created designated Provider Care Teams.  These Care Teams include your primary Cardiologist (physician) and Advanced Practice Providers (APPs -  Physician Assistants and Nurse Practitioners) who all work together to provide you with the care you need, when you need it.  We recommend signing up for the patient portal called "MyChart".   Sign up information is provided on this After Visit Summary.  MyChart is used to connect with patients for Virtual Visits (Telemedicine).  Patients are able to view lab/test results, encounter notes, upcoming appointments, etc.  Non-urgent messages can be sent to your provider as well.   To learn more about what you can do with MyChart, go to [email or web address].    Your next appointment:   5 month(s)  The format for your next appointment:   In Person  Provider:   Gypsy Balsam, MD   Other Instructions   Echocardiogram An echocardiogram is a test that uses sound waves (ultrasound) to produce images of the heart. Images from an echocardiogram can provide important information about:  Heart size and shape.  The size and thickness and movement of your heart's walls.  Heart muscle function and strength.  Heart valve function or if you have stenosis. Stenosis is when the heart valves are too narrow.  If blood is flowing backward through the heart valves (regurgitation).  A tumor or infectious growth around the heart valves.  Areas of heart muscle that are not working well because of poor blood flow or injury from a heart attack.  Aneurysm detection. An aneurysm is a weak or damaged part of an artery wall. The wall bulges out from the normal force of blood pumping through the body. Tell a health care provider about:  Any allergies you have.  All medicines you are taking, including vitamins, herbs, eye drops, creams, and over-the-counter medicines.  Any blood disorders you have.  Any surgeries you have had.  Any medical conditions you  have.  Whether you are pregnant or may be pregnant. What are the risks? Generally, this is a safe test. However, problems may occur, including an allergic reaction to dye (contrast) that may be used during the test. What happens before the test? No specific preparation is needed. You may eat and drink normally. What happens during the  test?  You will take off your clothes from the waist up and put on a hospital gown.  Electrodes or electrocardiogram (ECG)patches may be placed on your chest. The electrodes or patches are then connected to a device that monitors your heart rate and rhythm.  You will lie down on a table for an ultrasound exam. A gel will be applied to your chest to help sound waves pass through your skin.  A handheld device, called a transducer, will be pressed against your chest and moved over your heart. The transducer produces sound waves that travel to your heart and bounce back (or "echo" back) to the transducer. These sound waves will be captured in real-time and changed into images of your heart that can be viewed on a video monitor. The images will be recorded on a computer and reviewed by your health care provider.  You may be asked to change positions or hold your breath for a short time. This makes it easier to get different views or better views of your heart.  In some cases, you may receive contrast through an IV in one of your veins. This can improve the quality of the pictures from your heart. The procedure may vary among health care providers and hospitals.   What can I expect after the test? You may return to your normal, everyday life, including diet, activities, and medicines, unless your health care provider tells you not to do that. Follow these instructions at home:  It is up to you to get the results of your test. Ask your health care provider, or the department that is doing the test, when your results will be ready.  Keep all follow-up visits. This is important. Summary  An echocardiogram is a test that uses sound waves (ultrasound) to produce images of the heart.  Images from an echocardiogram can provide important information about the size and shape of your heart, heart muscle function, heart valve function, and other possible heart problems.  You do not need to do anything to  prepare before this test. You may eat and drink normally.  After the echocardiogram is completed, you may return to your normal, everyday life, unless your health care provider tells you not to do that. This information is not intended to replace advice given to you by your health care provider. Make sure you discuss any questions you have with your health care provider. Document Revised: 04/12/2020 Document Reviewed: 04/12/2020 Elsevier Patient Education  2021 Elsevier Inc.   Coronary Calcium Scan A coronary calcium scan is an imaging test used to look for deposits of plaque in the inner lining of the blood vessels of the heart (coronary arteries). Plaque is made up of calcium, protein, and fatty substances. These deposits of plaque can partly clog and narrow the coronary arteries without producing any symptoms or warning signs. This puts a person at risk for a heart attack. This test is recommended for people who are at moderate risk for heart disease. The test can find plaque deposits before symptoms develop. Tell a health care provider about:  Any allergies you have.  All medicines you are taking, including vitamins,  herbs, eye drops, creams, and over-the-counter medicines.  Any problems you or family members have had with anesthetic medicines.  Any blood disorders you have.  Any surgeries you have had.  Any medical conditions you have.  Whether you are pregnant or may be pregnant. What are the risks? Generally, this is a safe procedure. However, problems may occur, including:  Harm to a pregnant woman and her unborn baby. This test involves the use of radiation. Radiation exposure can be dangerous to a pregnant woman and her unborn baby. If you are pregnant or think you may be pregnant, you should not have this procedure done.  Slight increase in the risk of cancer. This is because of the radiation involved in the test. What happens before the procedure? Ask your health care  provider for any specific instructions on how to prepare for this procedure. You may be asked to avoid products that contain caffeine, tobacco, or nicotine for 4 hours before the procedure. What happens during the procedure?  You will undress and remove any jewelry from your neck or chest.  You will put on a hospital gown.  Sticky electrodes will be placed on your chest. The electrodes will be connected to an electrocardiogram (ECG) machine to record a tracing of the electrical activity of your heart.  You will lie down on a curved bed that is attached to the CT scanner.  You may be given medicine to slow down your heart rate so that clear pictures can be created.  You will be moved into the CT scanner, and the CT scanner will take pictures of your heart. During this time, you will be asked to lie still and hold your breath for 2-3 seconds at a time while each picture of your heart is being taken. The procedure may vary among health care providers and hospitals.   What happens after the procedure?  You can get dressed.  You can return to your normal activities.  It is up to you to get the results of your procedure. Ask your health care provider, or the department that is doing the procedure, when your results will be ready. Summary  A coronary calcium scan is an imaging test used to look for deposits of plaque in the inner lining of the blood vessels of the heart (coronary arteries). Plaque is made up of calcium, protein, and fatty substances.  Generally, this is a safe procedure. Tell your health care provider if you are pregnant or may be pregnant.  Ask your health care provider for any specific instructions on how to prepare for this procedure.  A CT scanner will take pictures of your heart.  You can return to your normal activities after the scan is done. This information is not intended to replace advice given to you by your health care provider. Make sure you discuss any  questions you have with your health care provider. Document Revised: 03/10/2019 Document Reviewed: 03/10/2019 Elsevier Patient Education  2021 [employer].

## 2021-01-24 NOTE — Progress Notes (Signed)
Cardiology Office Note:    Date:  01/24/2021   ID:  Seth Schneider, DOB 01/20/1956, MRN [id number]  PCP:  Seth Bass, MD  Cardiologist:  Seth Balsam, MD    Referring MD: Seth Bass, MD   Chief Complaint  Patient presents with  . Follow-up  I gout t dizzy couple days ago  History of Present Illness:    Seth Schneider is a 65 y.o. male with past medical history significant for left bundle branch block, mildly diminished left ventricle ejection fraction in the neighborhood of 45% based on echocardiogram from December 2021, dual-chamber pacemaker implantation secondary to sick sinus syndrome.  Dyslipidemia.  He comes today to my office for follow-up.  Overall he seems to be doing well.  Few weeks ago he got up in the morning he went to his mother house with his cats.  He did not drink anything he did in a template.  When he was walking over there he became dizzy and he almost fell down.  He went back home he sat down he drank some orange juice and then he was finding the matter-of-fact same day he was able to cut grass for hours and have no difficulty doing. Denies have any chest pain, tightness, pressure, burning in the chest.  No palpitations no dizziness.  Past Medical History:  Diagnosis Date  . Cardiomyopathy (HCC) 07/12/2015   Overview:  Overview:  Normalization of ejection fraction 2017 Overview:  Normalization of ejection fraction 2017  . Chronic kidney disease, stage 3 (HCC) 04/30/2018   2019: 43  . Complete heart block (HCC)   . Constipation 06/09/2020   Formatting of this note might be different from the original. 06/09/2020  . Diverticulosis of colon 04/25/2017   Formatting of this note might be different from the original. 2012: diverticulitis  . Dyslipidemia 07/12/2015  . Essential hypertension 07/12/2015  . Gout 04/29/2018   Formatting of this note might be different from the original. 2019: left first MTPJ, 8.6  . LBBB (left bundle branch block) 12/17/2018  .  Mixed hyperlipidemia 04/25/2017  . Nonischemic cardiomyopathy (HCC)   . Normal coronary arteries 12/17/2018  . Pacemaker 07/12/2015  . SSS (sick sinus syndrome) (HCC) 11/01/2015  . Ventricular tachycardia (HCC) 04/25/2017    Past Surgical History:  Procedure Laterality Date  . INSERT / REPLACE / REMOVE PACEMAKER     [place]  . UMBILICAL HERNIA REPAIR    . VASECTOMY      Current Medications: Current Meds  Medication Sig  . aspirin EC 81 MG tablet Take 1 tablet by mouth daily.   . carvedilol (COREG) 12.5 MG tablet Take 2 tablets by mouth twice daily (Patient taking differently: Take 12.5 mg by mouth 2 (two) times daily with a meal.)  . ENTRESTO 49-51 MG Take 1 tablet by mouth twice daily (Patient taking differently: Take 1 tablet by mouth 2 (two) times daily.)     Allergies:   Patient has no known allergies.   Social History   Socioeconomic History  . Marital status: Married    Spouse name: Not on file  . Number of children: Not on file  . Years of education: Not on file  . Highest education level: Not on file  Occupational History  . Not on file  Tobacco Use  . Smoking status: Never Smoker  . Smokeless tobacco: Never Used  Vaping Use  . Vaping Use: Never used  Substance and Sexual Activity  . Alcohol use: Not Currently  .  Drug use: Never  . Sexual activity: Not on file  Other Topics Concern  . Not on file  Social History Narrative  . Not on file   Social Determinants of Health   Financial Resource Strain: Not on file  Food Insecurity: Not on file  Transportation Needs: Not on file  Physical Activity: Not on file  Stress: Not on file  Social Connections: Not on file     Family History: The patient's family history includes Anxiety disorder in his mother; Hypertension in his mother; Parkinson's disease in his father; Thyroid disease in his mother. ROS:   Please see the history of present illness.    All 14 point review of systems negative except as described per  history of present illness  EKGs/Labs/Other Studies Reviewed:      Recent Labs: No results found for requested labs within last 8760 hours.  Recent Lipid Panel    Component Value Date/Time   CHOL 265 (H) 08/03/2020 0812   TRIG 344 (H) 08/03/2020 0812   HDL 38 (L) 08/03/2020 0812   CHOLHDL 7.0 (H) 08/03/2020 0812   LDLCALC 162 (H) 08/03/2020 0086    Physical Exam:    VS:  BP 106/72 (BP Location: Left Arm, Patient Position: Sitting)   Pulse 71   Ht 5\' 8"  (1.727 m)   Wt 223 lb (101.2 kg)   SpO2 90%   BMI 33.91 kg/m     Wt Readings from Last 3 Encounters:  01/24/21 223 lb (101.2 kg)  09/05/20 215 lb (97.5 kg)  08/02/20 218 lb (98.9 kg)     GEN:  Well nourished, well developed in no acute distress HEENT: Normal NECK: No JVD; No carotid bruits LYMPHATICS: No lymphadenopathy CARDIAC: RRR, no murmurs, no rubs, no gallops RESPIRATORY:  Clear to auscultation without rales, wheezing or rhonchi  ABDOMEN: Soft, non-tender, non-distended MUSCULOSKELETAL:  No edema; No deformity  SKIN: Warm and dry LOWER EXTREMITIES: no swelling NEUROLOGIC:  Alert and oriented x 3 PSYCHIATRIC:  Normal affect   ASSESSMENT:    1. Dilated cardiomyopathy (HCC)   2. LBBB (left bundle branch block)   3. Pacemaker   4. Mixed hyperlipidemia    PLAN:    In order of problems listed above:  1. Dilated cardiomyopathy I will repeat echocardiogram we will continue with Entresto as well as carvedilol.  I will ask him to have another echocardiogram to check.  More of an episode of dizziness that he have to look like he probably got somewhat dehydrated.  He happened in the morning he did not drink and eat anything after that episode he drank some orange juice and things became good to the point that he was able to cut grass.  EP is an echocardiogram showing preserved left ventricular ejection fraction.  We will try to cut down the dose of Entresto. 2. Dyslipidemia, his LDL is 162.  I did calculated his 10  years risk that came as 11.1 which is intermediate.  I think they there is a role for because mom score here.  I will schedule him to have a constant score trying to determine benefits of statin and potentially initiate this.  He is very reluctant to discuss the results I am doing it. 3. Pacemaker present, this is a 08/04/20, I did review interrogation, normal function.  We will continue monitoring. 4. Left bundle branch block.  Chronic.  Not   Medication Adjustments/Labs and Tests Ordered: Current medicines are reviewed at length with the patient  today.  Concerns regarding medicines are outlined above.  No orders of the defined types were placed in this encounter.  Medication changes: No orders of the defined types were placed in this encounter.   Signed, Georgeanna Lea, MD, [facility] 01/24/2021 9:36 AM     [facility]

## 2021-01-24 NOTE — Progress Notes (Signed)
Remote pacemaker transmission.   

## 2021-02-01 ENCOUNTER — Other Ambulatory Visit: Payer: Self-pay | Admitting: Cardiology

## 2021-02-07 ENCOUNTER — Ambulatory Visit (INDEPENDENT_AMBULATORY_CARE_PROVIDER_SITE_OTHER)
Admission: RE | Admit: 2021-02-07 | Discharge: 2021-02-07 | Disposition: A | Discharge status: Home / Self Care | Payer: Self-pay | Source: Ambulatory Visit | Attending: Cardiology | Admitting: Cardiology

## 2021-02-07 ENCOUNTER — Other Ambulatory Visit: Payer: Self-pay

## 2021-02-07 DIAGNOSIS — E782 Mixed hyperlipidemia: Secondary | ICD-10-CM

## 2021-02-08 ENCOUNTER — Telehealth: Payer: Self-pay | Admitting: Cardiology

## 2021-02-08 ENCOUNTER — Telehealth: Payer: Self-pay | Admitting: Emergency Medicine

## 2021-02-08 DIAGNOSIS — Z13220 Encounter for screening for lipoid disorders: Secondary | ICD-10-CM

## 2021-02-08 DIAGNOSIS — I420 Dilated cardiomyopathy: Secondary | ICD-10-CM

## 2021-02-08 NOTE — Telephone Encounter (Signed)
Patient informed of results see additional encounter.  

## 2021-02-08 NOTE — Telephone Encounter (Signed)
Called patient with results of CT. His next follow up is not until October will confirm how soon Dr. Bing Matter will like to see him back.

## 2021-02-08 NOTE — Telephone Encounter (Signed)
Pt is returning call from earlier today. Please advise 

## 2021-02-10 NOTE — Addendum Note (Signed)
Addended by: Hazle Quant on: 02/10/2021 03:24 PM   Modules accepted: Orders

## 2021-02-10 NOTE — Telephone Encounter (Signed)
Spoke to patient. He will have labs drawn. Orders in. No further questions.

## 2021-02-16 ENCOUNTER — Ambulatory Visit (INDEPENDENT_AMBULATORY_CARE_PROVIDER_SITE_OTHER): Payer: BC Managed Care – PPO

## 2021-02-16 ENCOUNTER — Other Ambulatory Visit: Payer: Self-pay

## 2021-02-16 DIAGNOSIS — E782 Mixed hyperlipidemia: Secondary | ICD-10-CM | POA: Diagnosis not present

## 2021-02-16 DIAGNOSIS — I447 Left bundle-branch block, unspecified: Secondary | ICD-10-CM

## 2021-02-16 DIAGNOSIS — Z950 Presence of cardiac pacemaker: Secondary | ICD-10-CM | POA: Diagnosis not present

## 2021-02-16 DIAGNOSIS — I420 Dilated cardiomyopathy: Secondary | ICD-10-CM | POA: Diagnosis not present

## 2021-02-16 LAB — LIPID PANEL
Chol/HDL Ratio: 6 ratio — ABNORMAL HIGH (ref 0.0–5.0)
Cholesterol, Total: 259 mg/dL — ABNORMAL HIGH (ref 100–199)
HDL: 43 mg/dL (ref >39)
LDL Chol Calc (NIH): 162 mg/dL — ABNORMAL HIGH (ref 0–99)
Triglycerides: 286 mg/dL — ABNORMAL HIGH (ref 0–149)
VLDL Cholesterol Cal: 54 mg/dL — ABNORMAL HIGH (ref 5–40)

## 2021-02-16 LAB — HEPATIC FUNCTION PANEL
ALT: 20 IU/L (ref 0–44)
AST: 19 IU/L (ref 0–40)
Albumin: 4.6 g/dL (ref 3.8–4.8)
Alkaline Phosphatase: 66 IU/L (ref 44–121)
Bilirubin Total: 0.7 mg/dL (ref 0.0–1.2)
Bilirubin, Direct: 0.13 mg/dL (ref 0.00–0.40)
Total Protein: 6.8 g/dL (ref 6.0–8.5)

## 2021-02-16 LAB — ECHOCARDIOGRAM COMPLETE
Area-P 1/2: 3.46 cm2
Calc EF: 46.4 %
S' Lateral: 3.1 cm
Single Plane A2C EF: 36.8 %
Single Plane A4C EF: 51 %

## 2021-02-20 ENCOUNTER — Telehealth: Payer: Self-pay | Admitting: Emergency Medicine

## 2021-02-20 DIAGNOSIS — E785 Hyperlipidemia, unspecified: Secondary | ICD-10-CM

## 2021-02-20 MED ORDER — ROSUVASTATIN CALCIUM 10 MG PO TABS: 10.0000 mg | ORAL_TABLET | Freq: Every day | ORAL | 1 refills | Status: DC

## 2021-02-20 NOTE — Telephone Encounter (Signed)
-----   Message from Georgeanna Lea, MD sent at 02/17/2021 10:53 AM EDT ----- Chol very high, start crestor 10 mg po qd  Flp ast alt in 6 weeks

## 2021-02-20 NOTE — Telephone Encounter (Signed)
Called patient informed him of results. He will start crestor 10 mg daily. He will have labs drawn in 6 weeks. No further questions.

## 2021-03-15 ENCOUNTER — Telehealth: Payer: Self-pay | Admitting: Cardiology

## 2021-03-15 NOTE — Telephone Encounter (Signed)
Pt c/o BP issue: STAT if pt c/o blurred vision, one-sided weakness or slurred speech  1. What are your last 5 BP readings? 105/55  2. Are you having any other symptoms (ex. Dizziness, headache, blurred vision, passed out)?  No 3. What is your BP issue? 117/69 1121/61 Pt's wife wants to know what a good BP is also she wants to know if pts BP meds should be altered

## 2021-03-15 NOTE — Telephone Encounter (Signed)
Spoke with patient and reviewed that blood pressures look great and within normal range. He verbalized understanding and was appreciative for the call.

## 2021-04-04 ENCOUNTER — Ambulatory Visit (INDEPENDENT_AMBULATORY_CARE_PROVIDER_SITE_OTHER): Payer: BC Managed Care – PPO

## 2021-04-04 DIAGNOSIS — I428 Other cardiomyopathies: Secondary | ICD-10-CM

## 2021-04-04 LAB — CUP PACEART REMOTE DEVICE CHECK
Battery Remaining Longevity: 120 mo
Battery Remaining Percentage: 100 %
Brady Statistic RA Percent Paced: 29 %
Brady Statistic RV Percent Paced: 0 %
Date Time Interrogation Session: 20220802045000
Implantable Lead Implant Date: 20031201
Implantable Lead Implant Date: 20031201
Implantable Lead Location: 753859
Implantable Lead Location: 753860
Implantable Lead Model: 4470
Implantable Lead Model: 5076
Implantable Lead Serial Number: 371531
Implantable Pulse Generator Implant Date: 20170629
Lead Channel Impedance Value: 434 Ohm
Lead Channel Impedance Value: 456 Ohm
Lead Channel Pacing Threshold Amplitude: 0.5 V
Lead Channel Pacing Threshold Amplitude: 1 V
Lead Channel Pacing Threshold Pulse Width: 0.4 ms
Lead Channel Pacing Threshold Pulse Width: 0.4 ms
Lead Channel Setting Pacing Amplitude: 2 V
Lead Channel Setting Pacing Amplitude: 2.5 V
Lead Channel Setting Pacing Pulse Width: 0.4 ms
Lead Channel Setting Sensing Sensitivity: 3.5 mV
Pulse Gen Serial Number: 755468

## 2021-04-19 DIAGNOSIS — S93401A Sprain of unspecified ligament of right ankle, initial encounter: Secondary | ICD-10-CM

## 2021-04-19 HISTORY — DX: Sprain of unspecified ligament of right ankle, initial encounter: S93.401A

## 2021-04-20 LAB — HEPATIC FUNCTION PANEL
ALT: 47 IU/L — ABNORMAL HIGH (ref 0–44)
AST: 40 IU/L (ref 0–40)
Albumin: 4.7 g/dL (ref 3.8–4.8)
Alkaline Phosphatase: 116 IU/L (ref 44–121)
Bilirubin Total: 0.9 mg/dL (ref 0.0–1.2)
Bilirubin, Direct: 0.19 mg/dL (ref 0.00–0.40)
Total Protein: 7.4 g/dL (ref 6.0–8.5)

## 2021-04-20 LAB — LIPID PANEL
Chol/HDL Ratio: 3.8 ratio (ref 0.0–5.0)
Cholesterol, Total: 196 mg/dL (ref 100–199)
HDL: 52 mg/dL (ref >39)
LDL Chol Calc (NIH): 114 mg/dL — ABNORMAL HIGH (ref 0–99)
Triglycerides: 170 mg/dL — ABNORMAL HIGH (ref 0–149)
VLDL Cholesterol Cal: 30 mg/dL (ref 5–40)

## 2021-04-24 ENCOUNTER — Other Ambulatory Visit: Payer: Self-pay

## 2021-04-24 DIAGNOSIS — E782 Mixed hyperlipidemia: Secondary | ICD-10-CM

## 2021-04-24 DIAGNOSIS — Z13220 Encounter for screening for lipoid disorders: Secondary | ICD-10-CM

## 2021-04-24 DIAGNOSIS — E785 Hyperlipidemia, unspecified: Secondary | ICD-10-CM

## 2021-04-29 NOTE — Progress Notes (Signed)
Remote pacemaker transmission.   

## 2021-05-01 DIAGNOSIS — S9001XA Contusion of right ankle, initial encounter: Secondary | ICD-10-CM

## 2021-05-01 HISTORY — DX: Contusion of right ankle, initial encounter: S90.01XA

## 2021-06-05 ENCOUNTER — Other Ambulatory Visit: Payer: Self-pay | Admitting: Cardiology

## 2021-06-15 ENCOUNTER — Other Ambulatory Visit: Payer: Self-pay | Admitting: Cardiology

## 2021-06-16 LAB — LIPID PANEL
Chol/HDL Ratio: 3.8 ratio (ref 0.0–5.0)
Cholesterol, Total: 170 mg/dL (ref 100–199)
HDL: 45 mg/dL (ref >39)
LDL Chol Calc (NIH): 95 mg/dL (ref 0–99)
Triglycerides: 175 mg/dL — ABNORMAL HIGH (ref 0–149)
VLDL Cholesterol Cal: 30 mg/dL (ref 5–40)

## 2021-06-16 LAB — HEPATIC FUNCTION PANEL
ALT: 29 IU/L (ref 0–44)
AST: 30 IU/L (ref 0–40)
Albumin: 4.3 g/dL (ref 3.8–4.8)
Alkaline Phosphatase: 65 IU/L (ref 44–121)
Bilirubin Total: 0.4 mg/dL (ref 0.0–1.2)
Bilirubin, Direct: 0.11 mg/dL (ref 0.00–0.40)
Total Protein: 6.5 g/dL (ref 6.0–8.5)

## 2021-06-19 ENCOUNTER — Other Ambulatory Visit: Payer: Self-pay | Admitting: Cardiology

## 2021-06-21 ENCOUNTER — Other Ambulatory Visit: Payer: Self-pay | Admitting: Emergency Medicine

## 2021-06-21 MED ORDER — ROSUVASTATIN CALCIUM 20 MG PO TABS: 20.0000 mg | ORAL_TABLET | Freq: Every day | ORAL | 1 refills | Status: DC

## 2021-07-04 ENCOUNTER — Ambulatory Visit (INDEPENDENT_AMBULATORY_CARE_PROVIDER_SITE_OTHER): Payer: BC Managed Care – PPO

## 2021-07-04 DIAGNOSIS — I428 Other cardiomyopathies: Secondary | ICD-10-CM | POA: Diagnosis not present

## 2021-07-04 LAB — CUP PACEART REMOTE DEVICE CHECK
Battery Remaining Longevity: 114 mo
Battery Remaining Percentage: 100 %
Brady Statistic RA Percent Paced: 27 %
Brady Statistic RV Percent Paced: 0 %
Date Time Interrogation Session: 20221101020000
Implantable Lead Implant Date: 20031201
Implantable Lead Implant Date: 20031201
Implantable Lead Location: 753859
Implantable Lead Location: 753860
Implantable Lead Model: 4470
Implantable Lead Model: 5076
Implantable Lead Serial Number: 371531
Implantable Pulse Generator Implant Date: 20170629
Lead Channel Impedance Value: 400 Ohm
Lead Channel Impedance Value: 420 Ohm
Lead Channel Pacing Threshold Amplitude: 0.5 V
Lead Channel Pacing Threshold Amplitude: 1.3 V
Lead Channel Pacing Threshold Pulse Width: 0.4 ms
Lead Channel Pacing Threshold Pulse Width: 0.4 ms
Lead Channel Setting Pacing Amplitude: 2 V
Lead Channel Setting Pacing Amplitude: 2.5 V
Lead Channel Setting Pacing Pulse Width: 0.4 ms
Lead Channel Setting Sensing Sensitivity: 3.5 mV
Pulse Gen Serial Number: 755468

## 2021-07-11 NOTE — Progress Notes (Signed)
Remote pacemaker transmission.   

## 2021-08-18 IMAGING — CT CT CARDIAC CORONARY ARTERY CALCIUM SCORE
3 series · 11 of 20 positions shown, 12 images · non-contrast
Comparison: 03/01/2016 chest radiograph
COMPARISON: 03/01/2016 chest radiograph
COMPARISON: 03/01/2016 chest radiograph

Addendum:
EXAM:
OVER-READ INTERPRETATION  CT CHEST

The following report is an over-read performed by radiologist Dr.
Natalie Amanda Toruke [REDACTED] on 02/07/2021. This over-read
does not include interpretation of cardiac or coronary anatomy or
pathology. The calcium score interpretation by the cardiologist is
attached.
CLINICAL DATA: Cardiovascular Disease Risk stratification
Coronary Calcium Score
TECHNIQUE: A gated, non-contrast computed tomography scan of the heart was
performed using 3mm slice thickness. Axial images were analyzed on a
dedicated workstation. Calcium scoring of the coronary arteries was
performed using the Agatston method.
CLINICAL DATA: Risk stratification
TECHNIQUE: The patient was scanned on a Siemens Force scanner. Axial
non-contrast 3 mm slices were carried out through the heart. The
data set was analyzed on a dedicated work station and scored using
the Agatson method.

[Series 2: casc 3.0 bv41 2 bestdiast 70 % · axial · 0.46mm/px · z∈[-111,-42]mm · 4 of 39 slices shown, 5 images]
[im 8/39  vessel]
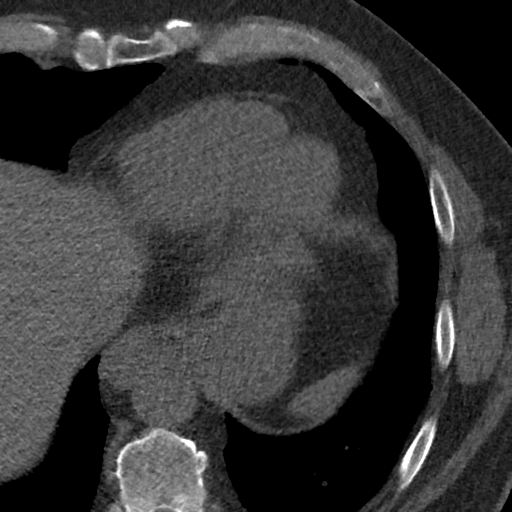
[im 8/39  lung]
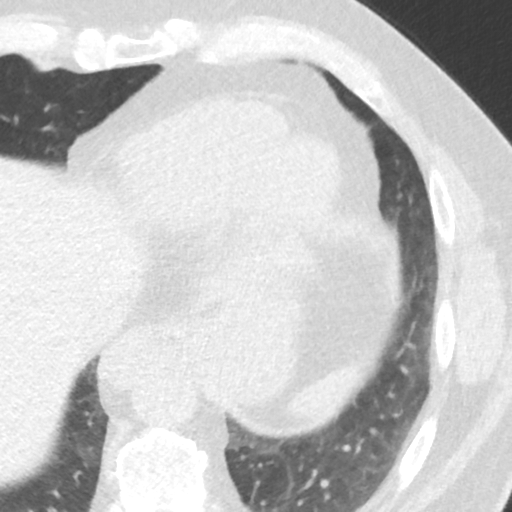
[im 16/39  vessel]
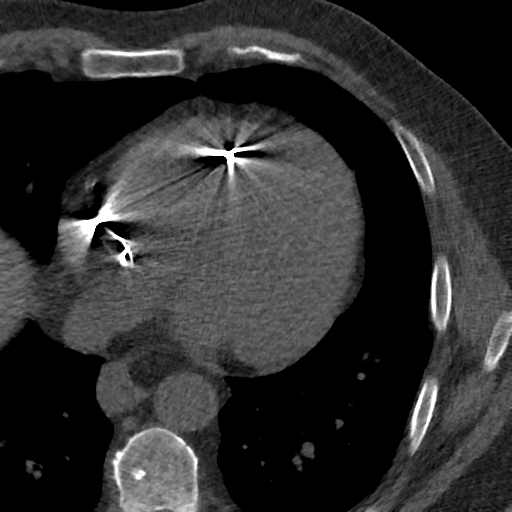
[im 23/39  vessel]
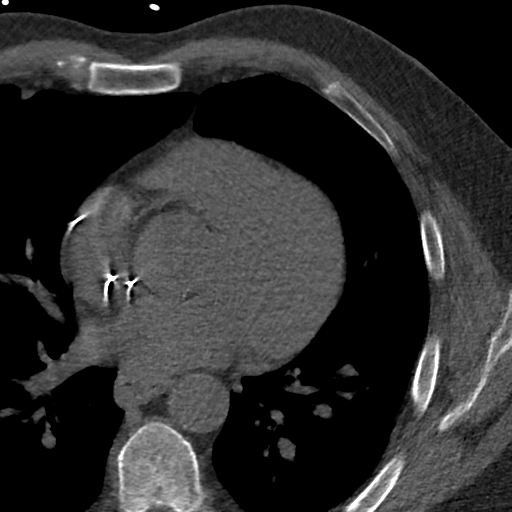
[im 31/39  vessel]
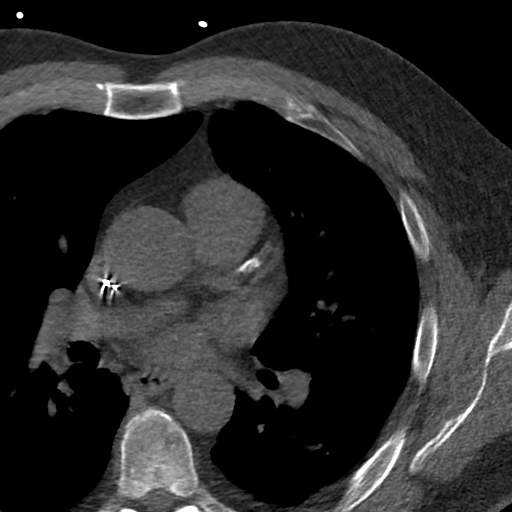

[Series 3: lung 70 % · axial · 0.76mm/px · z∈[-114,-39]mm · 5 of 39 slices shown]
[im 7/39  lung]
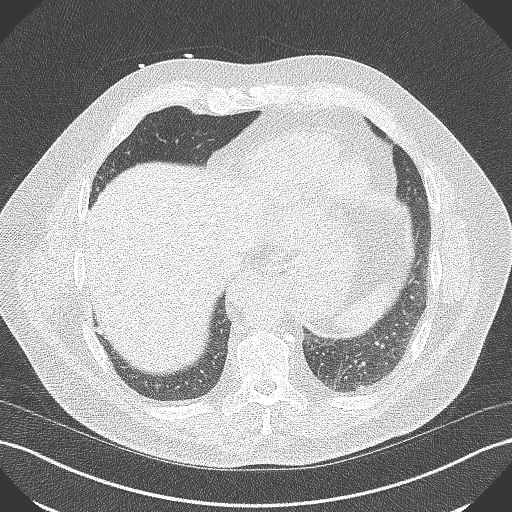
[im 13/39  lung]
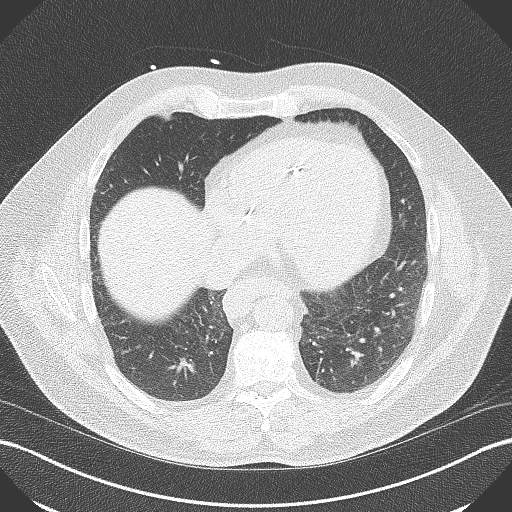
[im 20/39  lung]
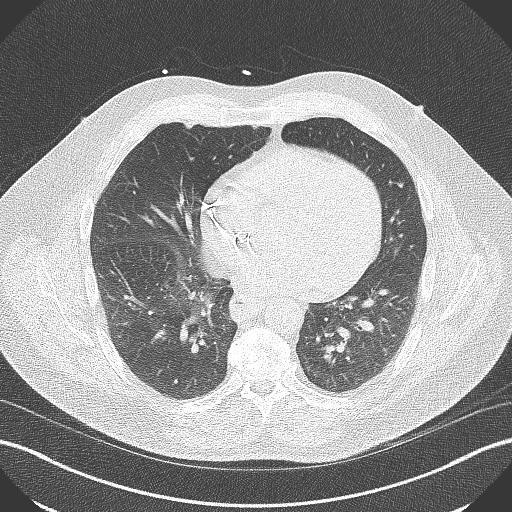
[im 26/39  lung]
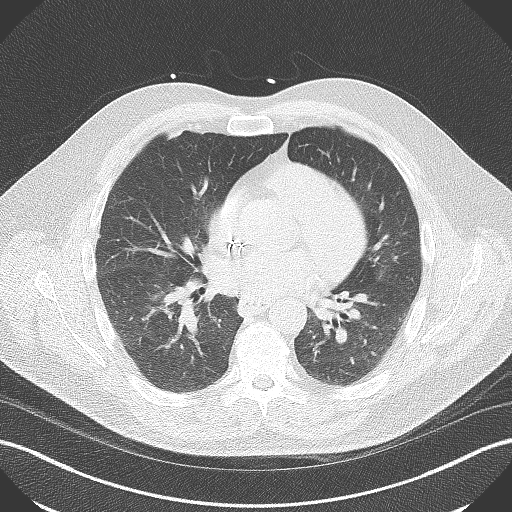
[im 32/39  lung]
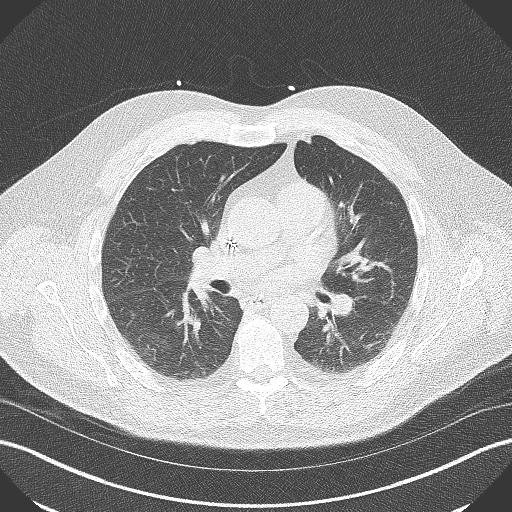

[Series 4: lung st 70 % · axial · 0.76mm/px · z∈[-114,-96]mm · 2 of 39 slices shown]
[im 7/39  lung]
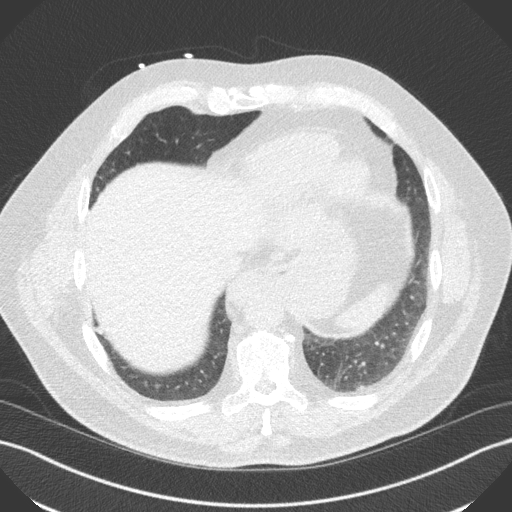
[im 13/39  lung]
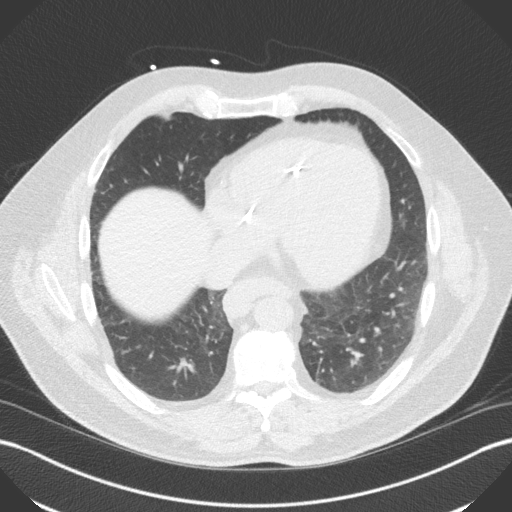

[11 of 20 positions shown; findings below may reference images not displayed]

FINDINGS: Vascular: Tortuous thoracic aorta.  Pacer.

Mediastinum/Nodes: No imaged thoracic adenopathy. Small hiatal
hernia.

Lungs/Pleura: No pleural fluid. Multiple bilateral subpleural
predominant pulmonary nodules. These are identified on series 3.
Example in the left lower lobe at 6 mm on 39/3. Along the right
minor fissure at 6 mm on [DATE].

Upper Abdomen: Normal imaged portions of the liver, spleen.

Musculoskeletal: No acute osseous abnormality.
IMPRESSION: 1.  No acute findings in the imaged extracardiac chest.
2. Bilateral pulmonary nodules of up to 6 mm, favored to represent
subpleural lymph nodes. Non-contrast chest CT at 6-12 months is
recommended. If the nodule is stable at time of repeat CT, then
future CT at 18-24 months (from today's scan) is considered optional
for low-risk patients, but is recommended for high-risk patients.
This recommendation follows the consensus statement: Guidelines for
Management of Incidental Pulmonary Nodules Detected on CT Images:
3. Small hiatal hernia.
FINDINGS: Coronary arteries: Normal origins.

Coronary Calcium Score:

Left main: 0

Left anterior descending artery: 210

Left circumflex artery:

Right coronary artery: Unable to assess

Total: 233

Percentile: 77th

Pericardium: Normal.

Ascending Aorta: Normal caliber.

Pacer leads present in RA and RV with beam artifact that limits
assessment of RCA for calcium score.

Non-cardiac: See separate report from [REDACTED].
IMPRESSION: Coronary calcium score of 233. This was 77th percentile for age-,
race-, and sex-matched controls but may be higher. The RCA cannot be
accurately assessed due to beam artifact from pacer leads.



If CAC=0, it is reasonable to withhold statin therapy and reassess
in 5 to 10 years, as long as higher risk conditions are absent
(diabetes mellitus, family history of premature CHD in first degree
relatives (males <55 years; females <65 years), cigarette smoking,
or LDL >=190 mg/dL).

If CAC is 1 to 99, it is reasonable to initiate statin therapy for
patients >=55 years of age.

If CAC is >=100 or >=75th percentile, it is reasonable to initiate
statin therapy at any age.

Cardiology referral should be considered for patients with CAC
scores >=400 or >=75th percentile.

*1365 AHA/ACC/AACVPR/AAPA/ABC/HAMM/FENGLEI/KAKI/Jerry/BETTONY/ARASH/GERYES
Guideline on the Management of Blood Cholesterol: A Report of the
American College of Cardiology/American Heart Association Task Force
on Clinical Practice Guidelines. J Am Coll Cardiol.
4856;73(24):4136-4831.
FINDINGS: Non-cardiac: See separate report from [REDACTED].

Ascending Aorta:

Pericardium: Normal

Coronary arteries: Normal origin.
IMPRESSION: Coronary calcium score of 395. This was 72 percentile for age and
sex matched control.

Significant artefacts from pacemaker leads noted.

*** End of Addendum ***
Addendum:
EXAM:
OVER-READ INTERPRETATION  CT CHEST

The following report is an over-read performed by radiologist Dr.
Natalie Amanda Toruke [REDACTED] on 02/07/2021. This over-read
does not include interpretation of cardiac or coronary anatomy or
pathology. The calcium score interpretation by the cardiologist is
attached.
FINDINGS: Vascular: Tortuous thoracic aorta.  Pacer.

Mediastinum/Nodes: No imaged thoracic adenopathy. Small hiatal
hernia.

Lungs/Pleura: No pleural fluid. Multiple bilateral subpleural
predominant pulmonary nodules. These are identified on series 3.
Example in the left lower lobe at 6 mm on 39/3. Along the right
minor fissure at 6 mm on [DATE].

Upper Abdomen: Normal imaged portions of the liver, spleen.

Musculoskeletal: No acute osseous abnormality.
IMPRESSION: 1.  No acute findings in the imaged extracardiac chest.
2. Bilateral pulmonary nodules of up to 6 mm, favored to represent
subpleural lymph nodes. Non-contrast chest CT at 6-12 months is
recommended. If the nodule is stable at time of repeat CT, then
future CT at 18-24 months (from today's scan) is considered optional
for low-risk patients, but is recommended for high-risk patients.
This recommendation follows the consensus statement: Guidelines for
Management of Incidental Pulmonary Nodules Detected on CT Images:
3. Small hiatal hernia.
FINDINGS: Coronary arteries: Normal origins.

Coronary Calcium Score:

Left main: 0

Left anterior descending artery: 210

Left circumflex artery:

Right coronary artery: Unable to assess

Total: 233

Percentile: 77th

Pericardium: Normal.

Ascending Aorta: Normal caliber.

Pacer leads present in RA and RV with beam artifact that limits
assessment of RCA for calcium score.

Non-cardiac: See separate report from [REDACTED].
IMPRESSION: Coronary calcium score of 233. This was 77th percentile for age-,
race-, and sex-matched controls but may be higher. The RCA cannot be
accurately assessed due to beam artifact from pacer leads.



If CAC=0, it is reasonable to withhold statin therapy and reassess
in 5 to 10 years, as long as higher risk conditions are absent
(diabetes mellitus, family history of premature CHD in first degree
relatives (males <55 years; females <65 years), cigarette smoking,
or LDL >=190 mg/dL).

If CAC is 1 to 99, it is reasonable to initiate statin therapy for
patients >=55 years of age.

If CAC is >=100 or >=75th percentile, it is reasonable to initiate
statin therapy at any age.

Cardiology referral should be considered for patients with CAC
scores >=400 or >=75th percentile.

*1365 AHA/ACC/AACVPR/AAPA/ABC/HAMM/FENGLEI/KAKI/Jerry/BETTONY/ARASH/GERYES
Guideline on the Management of Blood Cholesterol: A Report of the
American College of Cardiology/American Heart Association Task Force
on Clinical Practice Guidelines. J Am Coll Cardiol.
4856;73(24):4136-4831.

*** End of Addendum ***
EXAM:
OVER-READ INTERPRETATION  CT CHEST

The following report is an over-read performed by radiologist Dr.
Natalie Amanda Toruke [REDACTED] on 02/07/2021. This over-read
does not include interpretation of cardiac or coronary anatomy or
pathology. The calcium score interpretation by the cardiologist is
attached.
FINDINGS: Vascular: Tortuous thoracic aorta.  Pacer.

Mediastinum/Nodes: No imaged thoracic adenopathy. Small hiatal
hernia.

Lungs/Pleura: No pleural fluid. Multiple bilateral subpleural
predominant pulmonary nodules. These are identified on series 3.
Example in the left lower lobe at 6 mm on 39/3. Along the right
minor fissure at 6 mm on [DATE].

Upper Abdomen: Normal imaged portions of the liver, spleen.

Musculoskeletal: No acute osseous abnormality.
IMPRESSION: 1.  No acute findings in the imaged extracardiac chest.
2. Bilateral pulmonary nodules of up to 6 mm, favored to represent
subpleural lymph nodes. Non-contrast chest CT at 6-12 months is
recommended. If the nodule is stable at time of repeat CT, then
future CT at 18-24 months (from today's scan) is considered optional
for low-risk patients, but is recommended for high-risk patients.
This recommendation follows the consensus statement: Guidelines for
Management of Incidental Pulmonary Nodules Detected on CT Images:
3. Small hiatal hernia.

## 2021-08-21 ENCOUNTER — Ambulatory Visit: Payer: BC Managed Care – PPO | Admitting: Cardiology

## 2021-08-21 ENCOUNTER — Other Ambulatory Visit: Payer: Self-pay

## 2021-08-21 ENCOUNTER — Encounter: Payer: Self-pay | Admitting: Cardiology

## 2021-08-21 VITALS — BP 110/60 | HR 70 | Ht 68.5 in | Wt 230.0 lb

## 2021-08-21 DIAGNOSIS — I442 Atrioventricular block, complete: Secondary | ICD-10-CM | POA: Diagnosis not present

## 2021-08-21 DIAGNOSIS — I10 Essential (primary) hypertension: Secondary | ICD-10-CM | POA: Diagnosis not present

## 2021-08-21 DIAGNOSIS — I428 Other cardiomyopathies: Secondary | ICD-10-CM

## 2021-08-21 DIAGNOSIS — I447 Left bundle-branch block, unspecified: Secondary | ICD-10-CM | POA: Diagnosis not present

## 2021-08-21 DIAGNOSIS — Z950 Presence of cardiac pacemaker: Secondary | ICD-10-CM

## 2021-08-21 NOTE — Patient Instructions (Signed)
Medication Instructions:  Your physician recommends that you continue on your current medications as directed. Please refer to the Current Medication list given to you today.  *If you need a refill on your cardiac medications before your next appointment, please call your pharmacy*   Lab Work: NONE If you have labs (blood work) drawn today and your tests are completely normal, you will receive your results only by: MyChart Message (if you have MyChart) OR A paper copy in the mail If you have any lab test that is abnormal or we need to change your treatment, we will call you to review the results.   Testing/Procedures: EKG   Follow-Up: At [facility], you and your health needs are our priority.  As part of our continuing mission to provide you with exceptional heart care, we have created designated Provider Care Teams.  These Care Teams include your primary Cardiologist (physician) and Advanced Practice Providers (APPs -  Physician Assistants and Nurse Practitioners) who all work together to provide you with the care you need, when you need it.  We recommend signing up for the patient portal called "MyChart".  Sign up information is provided on this After Visit Summary.  MyChart is used to connect with patients for Virtual Visits (Telemedicine).  Patients are able to view lab/test results, encounter notes, upcoming appointments, etc.  Non-urgent messages can be sent to your provider as well.   To learn more about what you can do with MyChart, go to [email or web address].    Your next appointment:   6 month(s)  The format for your next appointment:   In Person  Provider:   Robert Krasowski, MD   Other Instructions   

## 2021-08-21 NOTE — Progress Notes (Signed)
Cardiology Office Note:    Date:  08/21/2021   ID:  Seth Schneider, DOB 1956/07/13, MRN [id number]  PCP:  Olive Bass, MD  Cardiologist:  Gypsy Balsam, MD    Referring MD: Olive Bass, MD   Chief Complaint  Patient presents with   Follow-up  I am doing fine  History of Present Illness:    Seth Schneider is a 65 y.o. male with past medical history significant for left bundle branch block, diminished ejection fraction Nebido 4045%, nonischemic, history of sick sinus syndrome, dual-chamber pacemaker present, dyslipidemia. He is coming today to my office for follow-up.  Overall he is doing very well.  Last time I seen him was in May when he had episode of syncope.  Evaluation done after that was unrevealing.  Echocardiogram has been repeated showed ejection fraction 40 to 45% which is just about what it was before.  Since that time he is doing well he still describes situations sometimes in the morning when he gets up very quickly will get dizzy I tell him to stay well-hydrated.  Otherwise denies have any chest pain tightness squeezing pressure burning chest no shortness of breath no swelling of lower extremities  Past Medical History:  Diagnosis Date   Cardiomyopathy (HCC) 07/12/2015   Overview:  Overview:  Normalization of ejection fraction 2017 Overview:  Normalization of ejection fraction 2017   Chronic kidney disease, stage 3 (HCC) 04/30/2018   2019: 43   Complete heart block (HCC)    Constipation 06/09/2020   Formatting of this note might be different from the original. 06/09/2020   Diverticulosis of colon 04/25/2017   Formatting of this note might be different from the original. 2012: diverticulitis   Dyslipidemia 07/12/2015   Essential hypertension 07/12/2015   Gout 04/29/2018   Formatting of this note might be different from the original. 2019: left first MTPJ, 8.6   LBBB (left bundle branch block) 12/17/2018   Mixed hyperlipidemia 04/25/2017   Nonischemic  cardiomyopathy (HCC)    Normal coronary arteries 12/17/2018   Pacemaker 07/12/2015   SSS (sick sinus syndrome) (HCC) 11/01/2015   Ventricular tachycardia 04/25/2017    Past Surgical History:  Procedure Laterality Date   INSERT / REPLACE / REMOVE PACEMAKER     [place]   UMBILICAL HERNIA REPAIR     VASECTOMY      Current Medications: Current Meds  Medication Sig   aspirin EC 81 MG tablet Take 1 tablet by mouth daily.    carvedilol (COREG) 12.5 MG tablet Take 2 tablets by mouth twice daily (Patient taking differently: Take 25 mg by mouth 2 (two) times daily with a meal.)   ENTRESTO 49-51 MG Take 1 tablet by mouth twice daily (Patient taking differently: Take 1 tablet by mouth 2 (two) times daily.)   rosuvastatin (CRESTOR) 20 MG tablet Take 1 tablet (20 mg total) by mouth daily.     Allergies:   Patient has no known allergies.   Social History   Socioeconomic History   Marital status: Married    Spouse name: Not on file   Number of children: Not on file   Years of education: Not on file   Highest education level: Not on file  Occupational History   Not on file  Tobacco Use   Smoking status: Never   Smokeless tobacco: Never  Vaping Use   Vaping Use: Never used  Substance and Sexual Activity   Alcohol use: Not Currently   Drug use: Never  Sexual activity: Not on file  Other Topics Concern   Not on file  Social History Narrative   Not on file   Social Determinants of Health   Financial Resource Strain: Not on file  Food Insecurity: Not on file  Transportation Needs: Not on file  Physical Activity: Not on file  Stress: Not on file  Social Connections: Not on file     Family History: The patient's family history includes Anxiety disorder in his mother; Hypertension in his mother; Parkinson's disease in his father; Thyroid disease in his mother. ROS:   Please see the history of present illness.    All 14 point review of systems negative except as described per  history of present illness  EKGs/Labs/Other Studies Reviewed:      Recent Labs: 06/15/2021: ALT 29  Recent Lipid Panel    Component Value Date/Time   CHOL 170 06/15/2021 1000   TRIG 175 (H) 06/15/2021 1000   HDL 45 06/15/2021 1000   CHOLHDL 3.8 06/15/2021 1000   LDLCALC 95 06/15/2021 1000    Physical Exam:    VS:  BP 110/60 (BP Location: Left Arm, Patient Position: Sitting)    Pulse 70    Ht 5' 8.5" (1.74 m)    Wt 230 lb (104.3 kg)    SpO2 97%    BMI 34.46 kg/m     Wt Readings from Last 3 Encounters:  08/21/21 230 lb (104.3 kg)  01/24/21 223 lb (101.2 kg)  09/05/20 215 lb (97.5 kg)     GEN:  Well nourished, well developed in no acute distress HEENT: Normal NECK: No JVD; No carotid bruits LYMPHATICS: No lymphadenopathy CARDIAC: RRR, no murmurs, no rubs, no gallops RESPIRATORY:  Clear to auscultation without rales, wheezing or rhonchi  ABDOMEN: Soft, non-tender, non-distended MUSCULOSKELETAL:  No edema; No deformity  SKIN: Warm and dry LOWER EXTREMITIES: no swelling NEUROLOGIC:  Alert and oriented x 3 PSYCHIATRIC:  Normal affect   ASSESSMENT:    1. Nonischemic cardiomyopathy (HCC)   2. LBBB (left bundle branch block)   3. Essential hypertension   4. Complete heart block (HCC)   5. Pacemaker    PLAN:    In order of problems listed above:  Nonischemic cardiomyopathy ejection fraction 40 to 45%, unchanged, he is already on Coreg as well as Entresto which I will continue.  I suspect ejection fraction is diminished because of bundle branch block.  If were sitting care and will consider upgrading his device.  But for now we stable. Left bundle branch block: Chronic. Essential hypertension: Blood pressure well controlled continue present management. Pacemaker present this is a [employer], political party last interrogation done 1 November normal function normal battery status.   Medication Adjustments/Labs and Tests Ordered: Current medicines are reviewed at length  with the patient today.  Concerns regarding medicines are outlined above.  No orders of the defined types were placed in this encounter.  Medication changes: No orders of the defined types were placed in this encounter.   Signed, Georgeanna Lea, MD, [facility] 08/21/2021 9:48 AM    [facility]

## 2021-09-10 NOTE — Progress Notes (Signed)
Electrophysiology Office Note   Date:  09/11/2021   ID:  Seth Schneider, DOB Apr 19, 1956, MRN [id number]  PCP:  Algis Greenhouse, MD  Cardiologist: Agustin Cree Primary Electrophysiologist:  [name] [name], MD    No chief complaint on file.    History of Present Illness: Seth Schneider is a 66 y.o. male who is being seen today for the evaluation of CHF at the request of Seth Schneider, Seth Graff, MD. Presenting today for electrophysiology evaluation.  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  He has complete heart block and is status post Catering manager.  Most recent echo shows an ejection fraction of 40 to 45%.  Today, denies symptoms of palpitations, chest pain, shortness of breath, orthopnea, PND, lower extremity edema, claudication, dizziness, presyncope, syncope, bleeding, or neurologic sequela. The patient is tolerating medications without difficulties.  Since being seen he has done well.  He has no chest pain or shortness of breath.  Is able to do all of his daily activities and is without restriction.   Past Medical History:  Diagnosis Date   Cardiomyopathy ([place]) 07/12/2015   Overview:  Overview:  Normalization of ejection fraction 2017 Overview:  Normalization of ejection fraction 2017   Chronic kidney disease, stage 3 ([place]) 04/30/2018   2019: 43   Complete heart block ([place])    Constipation 06/09/2020   Formatting of this note might be different from the original. 06/09/2020   Diverticulosis of colon 04/25/2017   Formatting of this note might be different from the original. 2012: diverticulitis   Dyslipidemia 07/12/2015   Essential hypertension 07/12/2015   Gout 04/29/2018   Formatting of this note might be different from the original. 2019: left first MTPJ, 8.6   LBBB (left bundle branch block) 12/17/2018   Mixed hyperlipidemia 04/25/2017   Nonischemic cardiomyopathy ([place])    Normal coronary arteries 12/17/2018   Pacemaker  07/12/2015   SSS (sick sinus syndrome) ([place]) 11/01/2015   Ventricular tachycardia 04/25/2017   Past Surgical History:  Procedure Laterality Date   INSERT / REPLACE / REMOVE PACEMAKER     [place]   UMBILICAL HERNIA REPAIR     VASECTOMY       Current Outpatient Medications  Medication Sig Dispense Refill   aspirin EC 81 MG tablet Take 1 tablet by mouth daily.      carvedilol (COREG) 12.5 MG tablet Take 2 tablets by mouth twice daily 360 tablet 1   ENTRESTO 49-51 MG Take 1 tablet by mouth twice daily 180 tablet 2   rosuvastatin (CRESTOR) 20 MG tablet Take 1 tablet (20 mg total) by mouth daily. 90 tablet 1   No current facility-administered medications for this visit.    Allergies:   Patient has no known allergies.   Social History:  The patient  reports that he has never smoked. He has never used smokeless tobacco. He reports that he does not currently use alcohol. He reports that he does not use drugs.   Family History:  The patient's family history includes Anxiety disorder in his mother; Hypertension in his mother; Parkinson's disease in his father; Thyroid disease in his mother.   ROS:  Please see the history of present illness.   Otherwise, review of systems is positive for none.   All other systems are reviewed and negative.   PHYSICAL EXAM: VS:  BP 104/62    Pulse 81    Ht 5' 8.5" (1.74 m)    Wt 232  lb 3.2 oz (105.3 kg)    SpO2 92%    BMI 34.79 kg/m  , BMI Body mass index is 34.79 kg/m. GEN: Well nourished, well developed, in no acute distress  HEENT: normal  Neck: no JVD, carotid bruits, or masses Cardiac: RRR; no murmurs, rubs, or gallops,no edema  Respiratory:  clear to auscultation bilaterally, normal work of breathing GI: soft, nontender, nondistended, + BS MS: no deformity or atrophy  Skin: warm and dry, device site well healed Neuro:  Strength and sensation are intact Psych: euthymic mood, full affect  EKG:  EKG is not ordered today. Personal review of the ekg  ordered 08/21/21 shows sinus rhythm, left bundle branch block  Personal review of the device interrogation today. Results in [place]: 06/15/2021: ALT 29    Lipid Panel     Component Value Date/Time   CHOL 170 06/15/2021 1000   TRIG 175 (H) 06/15/2021 1000   HDL 45 06/15/2021 1000   CHOLHDL 3.8 06/15/2021 1000   LDLCALC 95 06/15/2021 1000     Wt Readings from Last 3 Encounters:  09/11/21 232 lb 3.2 oz (105.3 kg)  08/21/21 230 lb (104.3 kg)  01/24/21 223 lb (101.2 kg)      Other studies Reviewed: Additional studies/ records that were reviewed today include: TTE 02/16/21  Review of the above records today demonstrates:   1. Left ventricular ejection fraction, by estimation, is 40 to 45%. The  left ventricle has mildly decreased function. The left ventricle has no  regional wall motion abnormalities. Left ventricular diastolic parameters  are consistent with Grade I  diastolic dysfunction (impaired relaxation).   2. Right ventricular systolic function is normal. The right ventricular  size is normal. There is normal pulmonary artery systolic pressure.   3. The mitral valve is normal in structure. No evidence of mitral valve  regurgitation. No evidence of mitral stenosis.   4. The aortic valve is normal in structure. Aortic valve regurgitation is  not visualized. No aortic stenosis is present.   5. Aneurysm of the ascending aorta, measuring 38 mm.   6. The inferior vena cava is normal in size with greater than 50%  respiratory variability, suggesting right atrial pressure of 3 mmHg.    ASSESSMENT AND PLAN:  1.  Complete heart block: Status post Catering manager.  Device functioning appropriately.  No changes at this time.  2.  Nonischemic cardiomyopathy: Ejection fraction 40 to 45%. Currently on carvedilol and Entresto.  He has a left bundle branch block.  He would potentially benefit from upgrade to CRT-P.  In discussion of this, he  states that finances are tight right now.  We Kruze Atchley reassess in 6 months.  Current medicines are reviewed at length with the patient today.   The patient does not have concerns regarding his medicines.  The following changes were made today: None  Labs/ tests ordered today include:  No orders of the defined types were placed in this encounter.    Disposition:   FU with Nerine Pulse 6 months  Signed, Mckensey Berghuis [name], MD  09/11/2021 10:47 AM     [facility] [address] [place] [address] [phone] (office) [phone] (fax)

## 2021-09-11 ENCOUNTER — Ambulatory Visit (INDEPENDENT_AMBULATORY_CARE_PROVIDER_SITE_OTHER): Payer: BC Managed Care – PPO | Admitting: Cardiology

## 2021-09-11 ENCOUNTER — Other Ambulatory Visit: Payer: Self-pay

## 2021-09-11 ENCOUNTER — Encounter: Payer: Self-pay | Admitting: Cardiology

## 2021-09-11 VITALS — BP 104/62 | HR 81 | Ht 68.5 in | Wt 232.2 lb

## 2021-09-11 DIAGNOSIS — I442 Atrioventricular block, complete: Secondary | ICD-10-CM

## 2021-09-11 NOTE — Patient Instructions (Signed)
Medication Instructions:  Your physician recommends that you continue on your current medications as directed. Please refer to the Current Medication list given to you today.  *If you need a refill on your cardiac medications before your next appointment, please call your pharmacy*   Lab Work: None today If you have labs (blood work) drawn today and your tests are completely normal, you will receive your results only by: [place] (if you have MyChart) OR A paper copy in the mail If you have any lab test that is abnormal or we need to change your treatment, we will call you to review the results.   Testing/Procedures: None today   Follow-Up: At [facility], you and your health needs are our priority.  As part of our continuing mission to provide you with exceptional heart care, we have created designated Provider Care Teams.  These Care Teams include your primary Cardiologist (physician) and Advanced Practice Providers (APPs -  Physician Assistants and Nurse Practitioners) who all work together to provide you with the care you need, when you need it.  We recommend signing up for the patient portal called "MyChart".  Sign up information is provided on this After Visit Summary.  MyChart is used to connect with patients for Virtual Visits (Telemedicine).  Patients are able to view lab/test results, encounter notes, upcoming appointments, etc.  Non-urgent messages can be sent to your provider as well.   To learn more about what you can do with MyChart, go to [email or web address].    Your next appointment:   6 month(s)  The format for your next appointment:   In Person  Provider:   Allegra Lai MD

## 2021-10-03 ENCOUNTER — Ambulatory Visit (INDEPENDENT_AMBULATORY_CARE_PROVIDER_SITE_OTHER): Payer: BC Managed Care – PPO

## 2021-10-03 DIAGNOSIS — I442 Atrioventricular block, complete: Secondary | ICD-10-CM

## 2021-10-03 LAB — CUP PACEART REMOTE DEVICE CHECK
Battery Remaining Longevity: 114 mo
Battery Remaining Percentage: 100 %
Brady Statistic RA Percent Paced: 27 %
Brady Statistic RV Percent Paced: 0 %
Date Time Interrogation Session: 20230131020100
Implantable Lead Implant Date: 20031201
Implantable Lead Implant Date: 20031201
Implantable Lead Location: 753859
Implantable Lead Location: 753860
Implantable Lead Model: 4470
Implantable Lead Model: 5076
Implantable Lead Serial Number: 371531
Implantable Pulse Generator Implant Date: 20170629
Lead Channel Impedance Value: 392 Ohm
Lead Channel Impedance Value: 419 Ohm
Lead Channel Pacing Threshold Amplitude: 0.4 V
Lead Channel Pacing Threshold Amplitude: 1.3 V
Lead Channel Pacing Threshold Pulse Width: 0.4 ms
Lead Channel Pacing Threshold Pulse Width: 0.4 ms
Lead Channel Setting Pacing Amplitude: 2 V
Lead Channel Setting Pacing Amplitude: 2.5 V
Lead Channel Setting Pacing Pulse Width: 0.4 ms
Lead Channel Setting Sensing Sensitivity: 0.6 mV
Pulse Gen Serial Number: 755468

## 2021-10-11 NOTE — Progress Notes (Signed)
Remote pacemaker transmission.   

## 2021-11-17 ENCOUNTER — Other Ambulatory Visit: Payer: Self-pay | Admitting: Cardiology

## 2021-12-08 ENCOUNTER — Other Ambulatory Visit: Payer: Self-pay | Admitting: Cardiology

## 2022-01-02 ENCOUNTER — Ambulatory Visit (INDEPENDENT_AMBULATORY_CARE_PROVIDER_SITE_OTHER): Payer: BC Managed Care – PPO

## 2022-01-02 DIAGNOSIS — I442 Atrioventricular block, complete: Secondary | ICD-10-CM | POA: Diagnosis not present

## 2022-01-04 LAB — CUP PACEART REMOTE DEVICE CHECK
Battery Remaining Longevity: 114 mo
Battery Remaining Percentage: 100 %
Brady Statistic RA Percent Paced: 27 %
Brady Statistic RV Percent Paced: 0 %
Date Time Interrogation Session: 20230503150400
Implantable Lead Implant Date: 20031201
Implantable Lead Implant Date: 20031201
Implantable Lead Location: 753859
Implantable Lead Location: 753860
Implantable Lead Model: 4470
Implantable Lead Model: 5076
Implantable Lead Serial Number: 371531
Implantable Pulse Generator Implant Date: 20170629
Lead Channel Impedance Value: 417 Ohm
Lead Channel Impedance Value: 428 Ohm
Lead Channel Pacing Threshold Amplitude: 0.4 V
Lead Channel Pacing Threshold Amplitude: 1.3 V
Lead Channel Pacing Threshold Pulse Width: 0.4 ms
Lead Channel Pacing Threshold Pulse Width: 0.4 ms
Lead Channel Setting Pacing Amplitude: 2 V
Lead Channel Setting Pacing Amplitude: 2.5 V
Lead Channel Setting Pacing Pulse Width: 0.4 ms
Lead Channel Setting Sensing Sensitivity: 0.6 mV
Pulse Gen Serial Number: 755468

## 2022-01-17 NOTE — Progress Notes (Signed)
Remote pacemaker transmission.   

## 2022-02-20 ENCOUNTER — Ambulatory Visit: Payer: BC Managed Care – PPO | Admitting: Cardiology

## 2022-02-20 ENCOUNTER — Encounter: Payer: Self-pay | Admitting: Cardiology

## 2022-02-20 VITALS — BP 116/70 | HR 66 | Ht 68.5 in | Wt 235.4 lb

## 2022-02-20 DIAGNOSIS — E785 Hyperlipidemia, unspecified: Secondary | ICD-10-CM

## 2022-02-20 DIAGNOSIS — I447 Left bundle-branch block, unspecified: Secondary | ICD-10-CM

## 2022-02-20 DIAGNOSIS — I495 Sick sinus syndrome: Secondary | ICD-10-CM

## 2022-02-20 DIAGNOSIS — I472 Ventricular tachycardia, unspecified: Secondary | ICD-10-CM | POA: Diagnosis not present

## 2022-02-20 DIAGNOSIS — I420 Dilated cardiomyopathy: Secondary | ICD-10-CM

## 2022-02-20 NOTE — Addendum Note (Signed)
Addended by: Roosvelt Harps R on: 02/20/2022 01:03 PM   Modules accepted: Orders

## 2022-02-20 NOTE — Progress Notes (Signed)
Cardiology Office Note:    Date:  02/20/2022   ID:  Seth Schneider, DOB 04-Mar-1956, MRN [id number]  PCP:  Olive Bass, MD  Cardiologist:  Gypsy Balsam, MD    Referring MD: Olive Bass, MD   Chief Complaint  Patient presents with   Follow-up    History of Present Illness:    Seth Schneider is a 66 y.o. male with past medical history significant for left bundle branch block, diminished left ventricle ejection fraction in the neighborhood of 45%, nonischemic, history of sick sinus syndrome, status post dual-chamber pacemaker implantation which is [employer] device, comes today to my office for follow-up.  Overall doing very well.  Denies have any chest pain tightness squeezing pressure burning chest.  Interestingly he is complaining of having pain in the right upper quadrant of the abdomen which is continuous for few days worse with pressing the wall of the abdomen.  There is no rebound tenderness there is no guarding.  No fever no chills no diarrhea no nausea.  Past Medical History:  Diagnosis Date   Cardiomyopathy (HCC) 07/12/2015   Overview:  Overview:  Normalization of ejection fraction 2017 Overview:  Normalization of ejection fraction 2017   Chronic kidney disease, stage 3 (HCC) 04/30/2018   2019: 43   Complete heart block (HCC)    Constipation 06/09/2020   Formatting of this note might be different from the original. 06/09/2020   Diverticulosis of colon 04/25/2017   Formatting of this note might be different from the original. 2012: diverticulitis   Dyslipidemia 07/12/2015   Essential hypertension 07/12/2015   Gout 04/29/2018   Formatting of this note might be different from the original. 2019: left first MTPJ, 8.6   LBBB (left bundle branch block) 12/17/2018   Mixed hyperlipidemia 04/25/2017   Nonischemic cardiomyopathy (HCC)    Normal coronary arteries 12/17/2018   Pacemaker 07/12/2015   SSS (sick sinus syndrome) (HCC) 11/01/2015   Ventricular tachycardia (HCC)  04/25/2017    Past Surgical History:  Procedure Laterality Date   INSERT / REPLACE / REMOVE PACEMAKER     [place]   UMBILICAL HERNIA REPAIR     VASECTOMY      Current Medications: Current Meds  Medication Sig   aspirin EC 81 MG tablet Take 1 tablet by mouth daily.    carvedilol (COREG) 12.5 MG tablet Take 2 tablets by mouth twice daily (Patient taking differently: Take 12.5 mg by mouth 2 (two) times daily with a meal.)   ENTRESTO 49-51 MG Take 1 tablet by mouth twice daily (Patient taking differently: Take 1 tablet by mouth 2 (two) times daily.)   rosuvastatin (CRESTOR) 20 MG tablet Take 1 tablet by mouth once daily (Patient taking differently: Take 20 mg by mouth daily.)     Allergies:   Patient has no known allergies.   Social History   Socioeconomic History   Marital status: Married    Spouse name: Not on file   Number of children: Not on file   Years of education: Not on file   Highest education level: Not on file  Occupational History   Not on file  Tobacco Use   Smoking status: Never   Smokeless tobacco: Never  Vaping Use   Vaping Use: Never used  Substance and Sexual Activity   Alcohol use: Not Currently   Drug use: Never   Sexual activity: Not on file  Other Topics Concern   Not on file  Social History Narrative  Not on file   Social Determinants of Health   Financial Resource Strain: Not on file  Food Insecurity: Not on file  Transportation Needs: Not on file  Physical Activity: Not on file  Stress: Not on file  Social Connections: Not on file     Family History: The patient's family history includes Anxiety disorder in his mother; Hypertension in his mother; Parkinson's disease in his father; Thyroid disease in his mother. ROS:   Please see the history of present illness.    All 14 point review of systems negative except as described per history of present illness  EKGs/Labs/Other Studies Reviewed:      Recent Labs: 06/15/2021: ALT 29   Recent Lipid Panel    Component Value Date/Time   CHOL 170 06/15/2021 1000   TRIG 175 (H) 06/15/2021 1000   HDL 45 06/15/2021 1000   CHOLHDL 3.8 06/15/2021 1000   LDLCALC 95 06/15/2021 1000    Physical Exam:    VS:  BP 116/70 (BP Location: Left Arm, Patient Position: Sitting)   Pulse 66   Ht 5' 8.5" (1.74 m)   Wt 235 lb 6.4 oz (106.8 kg)   SpO2 95%   BMI 35.27 kg/m     Wt Readings from Last 3 Encounters:  02/20/22 235 lb 6.4 oz (106.8 kg)  09/11/21 232 lb 3.2 oz (105.3 kg)  08/21/21 230 lb (104.3 kg)     GEN:  Well nourished, well developed in no acute distress HEENT: Normal NECK: No JVD; No carotid bruits LYMPHATICS: No lymphadenopathy CARDIAC: RRR, no murmurs, no rubs, no gallops RESPIRATORY:  Clear to auscultation without rales, wheezing or rhonchi  ABDOMEN: Soft, non-tender, non-distended MUSCULOSKELETAL:  No edema; No deformity  SKIN: Warm and dry LOWER EXTREMITIES: no swelling NEUROLOGIC:  Alert and oriented x 3 PSYCHIATRIC:  Normal affect   ASSESSMENT:    1. LBBB (left bundle branch block)   2. Dilated cardiomyopathy (HCC)   3. Ventricular tachycardia (HCC)   4. SSS (sick sinus syndrome) (HCC)   5. Dyslipidemia    PLAN:    In order of problems listed above:  Left bundle branch which is chronic noted continue present management. Dilated cardiomyopathy which is partially related to left bundle branch block.  He is on guideline directed medical therapy which includes Entresto as well as carvedilol which I will continue.  I will schedule him to have another echocardiogram done to recheck left ventricle ejection fraction.  If his ejection fraction is diminished more so then we will consider upgrading his device. Ventricular tachycardia denies having any dizziness or passing out Pacemaker present I did review interrogation plenty of battery left.  Parameters appropriate continue present management. Dyslipidemia I did review K PN which show me LDL of 95 HDL 45  this is from October of last year   Medication Adjustments/Labs and Tests Ordered: Current medicines are reviewed at length with the patient today.  Concerns regarding medicines are outlined above.  No orders of the defined types were placed in this encounter.  Medication changes: No orders of the defined types were placed in this encounter.   Signed, Georgeanna Lea, MD, [facility] 02/20/2022 12:56 PM    [facility]

## 2022-02-20 NOTE — Patient Instructions (Signed)
Medication Instructions:  Your physician recommends that you continue on your current medications as directed. Please refer to the Current Medication list given to you today.  *If you need a refill on your cardiac medications before your next appointment, please call your pharmacy*   Lab Work: NONE If you have labs (blood work) drawn today and your tests are completely normal, you will receive your results only by: MyChart Message (if you have MyChart) OR A paper copy in the mail If you have any lab test that is abnormal or we need to change your treatment, we will call you to review the results.   Testing/Procedures: Your physician has requested that you have an echocardiogram. Echocardiography is a painless test that uses sound waves to create images of your heart. It provides your doctor with information about the size and shape of your heart and how well your heart's chambers and valves are working. This procedure takes approximately one hour. There are no restrictions for this procedure.    Follow-Up: At [facility], you and your health needs are our priority.  As part of our continuing mission to provide you with exceptional heart care, we have created designated Provider Care Teams.  These Care Teams include your primary Cardiologist (physician) and Advanced Practice Providers (APPs -  Physician Assistants and Nurse Practitioners) who all work together to provide you with the care you need, when you need it.  We recommend signing up for the patient portal called "MyChart".  Sign up information is provided on this After Visit Summary.  MyChart is used to connect with patients for Virtual Visits (Telemedicine).  Patients are able to view lab/test results, encounter notes, upcoming appointments, etc.  Non-urgent messages can be sent to your provider as well.   To learn more about what you can do with MyChart, go to [email or web address].    Your next appointment:   6 month(s)  The  format for your next appointment:   In Person  Provider:   Robert Krasowski, MD    Other Instructions   Important Information About Sugar       

## 2022-02-22 ENCOUNTER — Ambulatory Visit (INDEPENDENT_AMBULATORY_CARE_PROVIDER_SITE_OTHER): Payer: BC Managed Care – PPO

## 2022-02-22 DIAGNOSIS — I472 Ventricular tachycardia, unspecified: Secondary | ICD-10-CM | POA: Diagnosis not present

## 2022-02-22 DIAGNOSIS — I447 Left bundle-branch block, unspecified: Secondary | ICD-10-CM

## 2022-02-22 DIAGNOSIS — I495 Sick sinus syndrome: Secondary | ICD-10-CM | POA: Diagnosis not present

## 2022-02-22 DIAGNOSIS — R1011 Right upper quadrant pain: Secondary | ICD-10-CM | POA: Insufficient documentation

## 2022-02-22 DIAGNOSIS — E785 Hyperlipidemia, unspecified: Secondary | ICD-10-CM

## 2022-02-22 DIAGNOSIS — I420 Dilated cardiomyopathy: Secondary | ICD-10-CM | POA: Diagnosis not present

## 2022-02-22 HISTORY — DX: Right upper quadrant pain: R10.11

## 2022-02-22 LAB — ECHOCARDIOGRAM COMPLETE
Area-P 1/2: 3.54 cm2
Calc EF: 55.5 %
S' Lateral: 2.8 cm
Single Plane A2C EF: 55.1 %
Single Plane A4C EF: 56.1 %

## 2022-02-23 ENCOUNTER — Telehealth: Payer: Self-pay

## 2022-02-23 NOTE — Telephone Encounter (Signed)
Results reviewed with pt as per Dr. Krasowski's note.  Pt verbalized understanding and had no additional questions. Routed to PCP  

## 2022-03-05 NOTE — Addendum Note (Signed)
Encounter addended by: Novella Olive on: 03/05/2022 4:32 PM  Actions taken: Letter saved

## 2022-03-17 ENCOUNTER — Other Ambulatory Visit: Payer: Self-pay | Admitting: Cardiology

## 2022-03-19 ENCOUNTER — Other Ambulatory Visit: Payer: Self-pay | Admitting: Cardiology

## 2022-03-19 NOTE — Telephone Encounter (Signed)
Rx refill sent to pharmacy. 

## 2022-03-20 ENCOUNTER — Telehealth: Payer: Self-pay

## 2022-03-20 ENCOUNTER — Telehealth: Payer: Self-pay | Admitting: Cardiology

## 2022-03-20 ENCOUNTER — Other Ambulatory Visit: Payer: Self-pay

## 2022-03-20 MED ORDER — CARVEDILOL 25 MG PO TABS: 25.0000 mg | ORAL_TABLET | Freq: Two times a day (BID) | ORAL | 3 refills | Status: DC

## 2022-03-20 NOTE — Telephone Encounter (Signed)
LVM for pt to call regarding Coreg dosage

## 2022-03-20 NOTE — Telephone Encounter (Signed)
Pt c/o medication issue:  1. Name of Medication: carvedilol (COREG) 12.5 MG tablet  2. How are you currently taking this medication (dosage and times per day)? 2 tabs twice daily  3. Are you having a reaction (difficulty breathing--STAT)?   4. What is your medication issue? Medication instructions have changed since his last refill. Needing clarification as to how they are suppose to be taking this medication.  Medication has changed to taking 1 tab 2 times daily. Requesting call back.

## 2022-03-20 NOTE — Telephone Encounter (Signed)
Pt and wife called. Dr.Krasowski had changed h is Carvedilol 12.5mg  1 bid to 2 tablets (25mg ) BID a while back and it had not been changed in the chart. Discussed with the pt and decided to send in 25mg  tablets to take 1 BID to [facility].

## 2022-04-03 ENCOUNTER — Ambulatory Visit (INDEPENDENT_AMBULATORY_CARE_PROVIDER_SITE_OTHER): Payer: BC Managed Care – PPO

## 2022-04-03 DIAGNOSIS — I495 Sick sinus syndrome: Secondary | ICD-10-CM

## 2022-04-04 LAB — CUP PACEART REMOTE DEVICE CHECK
Battery Remaining Longevity: 108 mo
Battery Remaining Percentage: 100 %
Brady Statistic RA Percent Paced: 28 %
Brady Statistic RV Percent Paced: 0 %
Date Time Interrogation Session: 20230802140300
Implantable Lead Implant Date: 20031201
Implantable Lead Implant Date: 20031201
Implantable Lead Location: 753859
Implantable Lead Location: 753860
Implantable Lead Model: 4470
Implantable Lead Model: 5076
Implantable Lead Serial Number: 371531
Implantable Pulse Generator Implant Date: 20170629
Lead Channel Impedance Value: 411 Ohm
Lead Channel Impedance Value: 425 Ohm
Lead Channel Pacing Threshold Amplitude: 0.5 V
Lead Channel Pacing Threshold Amplitude: 1.3 V
Lead Channel Pacing Threshold Pulse Width: 0.4 ms
Lead Channel Pacing Threshold Pulse Width: 0.4 ms
Lead Channel Setting Pacing Amplitude: 2 V
Lead Channel Setting Pacing Amplitude: 2.5 V
Lead Channel Setting Pacing Pulse Width: 0.4 ms
Lead Channel Setting Sensing Sensitivity: 0.6 mV
Pulse Gen Serial Number: 755468

## 2022-05-01 NOTE — Progress Notes (Signed)
Remote pacemaker transmission.   

## 2022-07-03 ENCOUNTER — Ambulatory Visit (INDEPENDENT_AMBULATORY_CARE_PROVIDER_SITE_OTHER): Payer: BC Managed Care – PPO

## 2022-07-03 DIAGNOSIS — I495 Sick sinus syndrome: Secondary | ICD-10-CM | POA: Diagnosis not present

## 2022-07-04 LAB — CUP PACEART REMOTE DEVICE CHECK
Battery Remaining Longevity: 102 mo
Battery Remaining Percentage: 100 %
Brady Statistic RA Percent Paced: 29 %
Brady Statistic RV Percent Paced: 0 %
Date Time Interrogation Session: 20231101141200
Implantable Lead Connection Status: 753985
Implantable Lead Connection Status: 753985
Implantable Lead Implant Date: 20031201
Implantable Lead Implant Date: 20031201
Implantable Lead Location: 753859
Implantable Lead Location: 753860
Implantable Lead Model: 4470
Implantable Lead Model: 5076
Implantable Lead Serial Number: 371531
Implantable Pulse Generator Implant Date: 20170629
Lead Channel Impedance Value: 426 Ohm
Lead Channel Impedance Value: 463 Ohm
Lead Channel Pacing Threshold Amplitude: 0.5 V
Lead Channel Pacing Threshold Amplitude: 1.2 V
Lead Channel Pacing Threshold Pulse Width: 0.4 ms
Lead Channel Pacing Threshold Pulse Width: 0.4 ms
Lead Channel Setting Pacing Amplitude: 2 V
Lead Channel Setting Pacing Amplitude: 2.5 V
Lead Channel Setting Pacing Pulse Width: 0.4 ms
Lead Channel Setting Sensing Sensitivity: 0.6 mV
Pulse Gen Serial Number: 755468
Zone Setting Status: 755011

## 2022-07-18 NOTE — Progress Notes (Signed)
Remote pacemaker transmission.   

## 2022-08-13 DIAGNOSIS — H6123 Impacted cerumen, bilateral: Secondary | ICD-10-CM

## 2022-08-13 DIAGNOSIS — B354 Tinea corporis: Secondary | ICD-10-CM

## 2022-08-13 HISTORY — DX: Impacted cerumen, bilateral: H61.23

## 2022-08-13 HISTORY — DX: Tinea corporis: B35.4

## 2022-08-17 ENCOUNTER — Other Ambulatory Visit: Payer: Self-pay | Admitting: Cardiology

## 2022-08-20 ENCOUNTER — Encounter: Payer: Self-pay | Admitting: Cardiology

## 2022-08-20 ENCOUNTER — Ambulatory Visit: Payer: BC Managed Care – PPO | Attending: Cardiology | Admitting: Cardiology

## 2022-08-20 VITALS — BP 100/72 | HR 60 | Ht 68.5 in | Wt 236.6 lb

## 2022-08-20 DIAGNOSIS — I447 Left bundle-branch block, unspecified: Secondary | ICD-10-CM | POA: Diagnosis not present

## 2022-08-20 DIAGNOSIS — Z950 Presence of cardiac pacemaker: Secondary | ICD-10-CM

## 2022-08-20 DIAGNOSIS — I10 Essential (primary) hypertension: Secondary | ICD-10-CM | POA: Diagnosis not present

## 2022-08-20 DIAGNOSIS — I442 Atrioventricular block, complete: Secondary | ICD-10-CM

## 2022-08-20 DIAGNOSIS — I420 Dilated cardiomyopathy: Secondary | ICD-10-CM

## 2022-08-20 DIAGNOSIS — I472 Ventricular tachycardia, unspecified: Secondary | ICD-10-CM

## 2022-08-20 NOTE — Patient Instructions (Signed)

## 2022-08-20 NOTE — Progress Notes (Signed)
Cardiology Office Note:    Date:  08/20/2022   ID:  Seth Schneider, DOB Jan 17, 1956, MRN [id number]  PCP:  Olive Bass, MD  Cardiologist:  Gypsy Balsam, MD    Referring MD: Olive Bass, MD   Chief Complaint  Patient presents with   Follow-up    History of Present Illness:    Seth Schneider is a 66 y.o. male with past medical history significant for left bundle branch block, ejection fraction 45%, nonischemic, history of sick sinus syndrome, status post dual-chamber pacemaker implantation which is [employer].  He is in my office today for follow-up.  Overall doing well.  He denies of any chest pain tightness squeezing pressure burning chest no palpitations dizziness swelling of lower extremities.  Last time I seen him we did echocardiogram to check left ventricle ejection fraction which remains unchanged at 45%.  If ejection fraction drops that we talk about potentially upgrading his device to BiV but since his ejection fraction is unchanged continue present management  Past Medical History:  Diagnosis Date   Cardiomyopathy (HCC) 07/12/2015   Overview:  Overview:  Normalization of ejection fraction 2017 Overview:  Normalization of ejection fraction 2017   Chronic kidney disease, stage 3 (HCC) 04/30/2018   2019: 43   Complete heart block (HCC)    Constipation 06/09/2020   Formatting of this note might be different from the original. 06/09/2020   Diverticulosis of colon 04/25/2017   Formatting of this note might be different from the original. 2012: diverticulitis   Dyslipidemia 07/12/2015   Essential hypertension 07/12/2015   Gout 04/29/2018   Formatting of this note might be different from the original. 2019: left first MTPJ, 8.6   LBBB (left bundle branch block) 12/17/2018   Mixed hyperlipidemia 04/25/2017   Nonischemic cardiomyopathy (HCC)    Normal coronary arteries 12/17/2018   Pacemaker 07/12/2015   SSS (sick sinus syndrome) (HCC) 11/01/2015   Ventricular  tachycardia (HCC) 04/25/2017    Past Surgical History:  Procedure Laterality Date   INSERT / REPLACE / REMOVE PACEMAKER     [place]   UMBILICAL HERNIA REPAIR     VASECTOMY      Current Medications: Current Meds  Medication Sig   aspirin EC 81 MG tablet Take 1 tablet by mouth daily.    carvedilol (COREG) 25 MG tablet Take 1 tablet (25 mg total) by mouth 2 (two) times daily.   rosuvastatin (CRESTOR) 20 MG tablet Take 1 tablet (20 mg total) by mouth daily.   sacubitril-valsartan (ENTRESTO) 49-51 MG Take 1 tablet by mouth 2 (two) times daily.     Allergies:   Patient has no known allergies.   Social History   Socioeconomic History   Marital status: Married    Spouse name: Not on file   Number of children: Not on file   Years of education: Not on file   Highest education level: Not on file  Occupational History   Not on file  Tobacco Use   Smoking status: Never   Smokeless tobacco: Never  Vaping Use   Vaping Use: Never used  Substance and Sexual Activity   Alcohol use: Not Currently   Drug use: Never   Sexual activity: Not on file  Other Topics Concern   Not on file  Social History Narrative   Not on file   Social Determinants of Health   Financial Resource Strain: Not on file  Food Insecurity: Not on file  Transportation Needs: Not on  file  Physical Activity: Not on file  Stress: Not on file  Social Connections: Not on file     Family History: The patient's family history includes Anxiety disorder in his mother; Hypertension in his mother; Parkinson's disease in his father; Thyroid disease in his mother. ROS:   Please see the history of present illness.    All 14 point review of systems negative except as described per history of present illness  EKGs/Labs/Other Studies Reviewed:      Recent Labs: No results found for requested labs within last 365 days.  Recent Lipid Panel    Component Value Date/Time   CHOL 170 06/15/2021 1000   TRIG 175 (H)  06/15/2021 1000   HDL 45 06/15/2021 1000   CHOLHDL 3.8 06/15/2021 1000   LDLCALC 95 06/15/2021 1000    Physical Exam:    VS:  BP 100/72 (BP Location: Left Arm, Patient Position: Sitting)   Pulse 60   Ht 5' 8.5" (1.74 m)   Wt 236 lb 9.6 oz (107.3 kg)   SpO2 97%   BMI 35.45 kg/m     Wt Readings from Last 3 Encounters:  08/20/22 236 lb 9.6 oz (107.3 kg)  02/20/22 235 lb 6.4 oz (106.8 kg)  09/11/21 232 lb 3.2 oz (105.3 kg)     GEN:  Well nourished, well developed in no acute distress HEENT: Normal NECK: No JVD; No carotid bruits LYMPHATICS: No lymphadenopathy CARDIAC: RRR, no murmurs, no rubs, no gallops RESPIRATORY:  Clear to auscultation without rales, wheezing or rhonchi  ABDOMEN: Soft, non-tender, non-distended MUSCULOSKELETAL:  No edema; No deformity  SKIN: Warm and dry LOWER EXTREMITIES: no swelling NEUROLOGIC:  Alert and oriented x 3 PSYCHIATRIC:  Normal affect   ASSESSMENT:    1. Dilated cardiomyopathy (HCC)   2. LBBB (left bundle branch block)   3. Essential hypertension   4. Complete heart block (HCC)   5. Ventricular tachycardia (HCC)   6. Pacemaker    PLAN:    In order of problems listed above:  Dilated cardiomyopathy on appropriate guideline directed medical therapy, I cannot augment his therapy because of low blood pressure but overall clinically euvolemic and doing well overall without dropping ejection fraction Left bundle branch block it is a chronic issue Essential hypertension: She will have blood pressure being low Pacemaker present I did review interrogation normal function continue present management Ventricle tachycardia and is having dizziness interrogation of the device did not show any prolonged episodes   Medication Adjustments/Labs and Tests Ordered: Current medicines are reviewed at length with the patient today.  Concerns regarding medicines are outlined above.  Orders Placed This Encounter  Procedures   EKG 12-Lead   Medication  changes: No orders of the defined types were placed in this encounter.   Signed, Georgeanna Lea, MD, [facility] 08/20/2022 3:16 PM    [facility]

## 2022-10-02 ENCOUNTER — Ambulatory Visit: Payer: BC Managed Care – PPO

## 2022-10-02 DIAGNOSIS — I495 Sick sinus syndrome: Secondary | ICD-10-CM

## 2022-10-02 LAB — CUP PACEART REMOTE DEVICE CHECK
Battery Remaining Longevity: 102 mo
Battery Remaining Percentage: 97 %
Brady Statistic RA Percent Paced: 29 %
Brady Statistic RV Percent Paced: 0 %
Date Time Interrogation Session: 20240130020100
Implantable Lead Connection Status: 753985
Implantable Lead Connection Status: 753985
Implantable Lead Implant Date: 20031201
Implantable Lead Implant Date: 20031201
Implantable Lead Location: 753859
Implantable Lead Location: 753860
Implantable Lead Model: 4470
Implantable Lead Model: 5076
Implantable Lead Serial Number: 371531
Implantable Pulse Generator Implant Date: 20170629
Lead Channel Impedance Value: 395 Ohm
Lead Channel Impedance Value: 441 Ohm
Lead Channel Pacing Threshold Amplitude: 0.4 V
Lead Channel Pacing Threshold Amplitude: 1.4 V
Lead Channel Pacing Threshold Pulse Width: 0.4 ms
Lead Channel Pacing Threshold Pulse Width: 0.4 ms
Lead Channel Setting Pacing Amplitude: 2 V
Lead Channel Setting Pacing Amplitude: 2.5 V
Lead Channel Setting Pacing Pulse Width: 0.4 ms
Lead Channel Setting Sensing Sensitivity: 0.6 mV
Pulse Gen Serial Number: 755468
Zone Setting Status: 755011

## 2022-10-29 ENCOUNTER — Ambulatory Visit: Payer: BC Managed Care – PPO | Admitting: Podiatry

## 2022-10-29 ENCOUNTER — Ambulatory Visit (INDEPENDENT_AMBULATORY_CARE_PROVIDER_SITE_OTHER): Payer: BC Managed Care – PPO

## 2022-10-29 DIAGNOSIS — M21622 Bunionette of left foot: Secondary | ICD-10-CM

## 2022-10-29 DIAGNOSIS — M778 Other enthesopathies, not elsewhere classified: Secondary | ICD-10-CM | POA: Diagnosis not present

## 2022-10-29 DIAGNOSIS — M21612 Bunion of left foot: Secondary | ICD-10-CM | POA: Diagnosis not present

## 2022-10-29 DIAGNOSIS — M205X2 Other deformities of toe(s) (acquired), left foot: Secondary | ICD-10-CM

## 2022-10-29 NOTE — Progress Notes (Signed)
Remote pacemaker transmission.   

## 2022-10-29 NOTE — Progress Notes (Signed)
Subjective:  Patient ID: Seth Schneider, male    DOB: 08/08/1956,  MRN: [id number]  Chief Complaint  Patient presents with   Foot Pain    left foot pain-mostly while wearing shoes    67 y.o. male presents with left foot pain.  He says that he has pain primarily when he is wearing shoes.  He has very wide feet and has a bunion on his left foot which is bothering him.  He does not have pain when without shoes on is more so when he is wearing shoes as pressure on the inside of his left foot near the bump which causes him pain.  He also notes that his fifth toe is slightly rotated and has some discomfort with pressure on the outside of the left forefoot as well.  Has tried wider toebox shoes but they have not been effective at reducing his pain.  Past Medical History:  Diagnosis Date   Cardiomyopathy ([place]) 07/12/2015   Overview:  Overview:  Normalization of ejection fraction 2017 Overview:  Normalization of ejection fraction 2017   Chronic kidney disease, stage 3 ([place]) 04/30/2018   2019: 43   Complete heart block ([place])    Constipation 06/09/2020   Formatting of this note might be different from the original. 06/09/2020   Diverticulosis of colon 04/25/2017   Formatting of this note might be different from the original. 2012: diverticulitis   Dyslipidemia 07/12/2015   Essential hypertension 07/12/2015   Gout 04/29/2018   Formatting of this note might be different from the original. 2019: left first MTPJ, 8.6   LBBB (left bundle branch block) 12/17/2018   Mixed hyperlipidemia 04/25/2017   Nonischemic cardiomyopathy ([place])    Normal coronary arteries 12/17/2018   Pacemaker 07/12/2015   SSS (sick sinus syndrome) ([place]) 11/01/2015   Ventricular tachycardia ([place]) 04/25/2017    No Known Allergies  ROS: Negative except as per HPI above  Objective:  General: AAO x3, NAD  Dermatological: With inspection and palpation of the right and left lower extremities there are no open sores, no preulcerative  lesions, no rash or signs of infection present. Nails are of normal length thickness and coloration.   Vascular:  Dorsalis Pedis artery and Posterior Tibial artery pedal pulses are 2/4 bilateral.  Capillary fill time < 3 sec to all digits.   Neruologic: Grossly intact via light touch bilateral. Protective threshold intact to all sites bilateral.   Musculoskeletal: Hallux abductovalgus deformity is present on the left foot with pain at the medial eminence with palpation.  There is no significant hypermobility of the first ray.  Clinically the bunion deformity would be in the mild to moderate category.  There is also adductovarus rotation noted of the fifth toe and prominence noted at the lateral aspect of the fifth metatarsal head.  Slight discomfort with palpation of the lateral aspect of the fifth metatarsal head.  Overall widened foot structure.  Gait: Unassisted, Nonantalgic.   No images are attached to the encounter.  Radiographs:  Date: 10/29/2022 XR the left foot Weightbearing AP/Lateral/Oblique   Findings: Attention directed to the left foot there is to be slightly increase in first and metatarsal angle however is not severe.  There is slight hallux valgus angle increased.  Attention to the fifth metatarsal there is noted the lateral deviation of the distal aspect of the fifth metatarsal head with lateral bow of the metatarsal.  The fifth toe is slightly in a varus position at the MPJ. Assessment:   1. Bunion  of left foot   2. 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 Tailor's bunion of left foot   4. Capsulitis of foot, left      Plan:  Patient was evaluated and treated and all questions answered.  # Bunion of left foot # Tailor's bunion of left foot # Adductovarus rotation of fifth toe of left foot -Discussed with the patient that he does have a bunion deformity present that appears to be causing him pain clinically -Radiographs confirm bunion and also tailor's  bunion deformity.  Having some pain at the fifth MPJ as well as in addition to fifth toe rotational deformity -I discussed conservative or surgical treatment options of the above problems. -Conservatively could attempt wider toebox shoes and toe spacers however these have not been fully effective -Surgically plan would be for bunionectomy as well as tailor's bunionectomy and fifth toE adductovarus deformity correction.  Discussed options for the bunionectomy and tailor's bunionectomy either open [place] style bunionectomy versus minimally invasive distal first metatarsal osteotomy and possible Akin osteotomy for the first ray as well as possible minimally invasive distal metatarsal osteotomy with fixation for the fifth metatarsal. -I would recommend minimally invasive bunionectomy with distal first metatarsal osteotomy possible Akin osteotomy as well as minimally invasive distal fifth metatarsal osteotomy and arthroplasty of the proximal interphalangeal joint with the rotation of the toe.  Believe this will decrease his pain related to the medial and lateral eminence present on the left forefoot while offering the fastest recovery.  Overall do not feel he is at significant risk for recurrence of the bunion deformity as he does not have severe hypermobility in the first ray that would otherwise necessitate Lap[place]us procedure -Patient would like to think about the procedures and discussed with his wife however we proceeded to discuss the risk benefits alternatives and possible complications associated with these procedures.  Discussed the expected postop recovery course.  Informed surgical consent was obtained at this visit.  Will begin with surgical planning if he wishes to proceed following discussion with family.         Everitt Amber, DPM [facility]

## 2022-10-31 ENCOUNTER — Telehealth: Payer: Self-pay

## 2022-10-31 NOTE — Telephone Encounter (Signed)
Received surgery paperwork from the [facility] office. Spoke to Santiago Glad (wife) and she stated they want to wait till he goes on Medicare in July,. They have a high deductible with [employer]. She will call me back to schedule surgery once they have the new insurance information.

## 2023-01-01 ENCOUNTER — Ambulatory Visit (INDEPENDENT_AMBULATORY_CARE_PROVIDER_SITE_OTHER): Payer: BC Managed Care – PPO

## 2023-01-01 DIAGNOSIS — I495 Sick sinus syndrome: Secondary | ICD-10-CM

## 2023-01-02 LAB — CUP PACEART REMOTE DEVICE CHECK
Battery Remaining Longevity: 102 mo
Battery Remaining Percentage: 97 %
Brady Statistic RA Percent Paced: 30 %
Brady Statistic RV Percent Paced: 0 %
Date Time Interrogation Session: 20240430072600
Implantable Lead Connection Status: 753985
Implantable Lead Connection Status: 753985
Implantable Lead Implant Date: 20031201
Implantable Lead Implant Date: 20031201
Implantable Lead Location: 753859
Implantable Lead Location: 753860
Implantable Lead Model: 4470
Implantable Lead Model: 5076
Implantable Lead Serial Number: 371531
Implantable Pulse Generator Implant Date: 20170629
Lead Channel Impedance Value: 400 Ohm
Lead Channel Impedance Value: 438 Ohm
Lead Channel Pacing Threshold Amplitude: 0.4 V
Lead Channel Pacing Threshold Amplitude: 1.3 V
Lead Channel Pacing Threshold Pulse Width: 0.4 ms
Lead Channel Pacing Threshold Pulse Width: 0.4 ms
Lead Channel Setting Pacing Amplitude: 2 V
Lead Channel Setting Pacing Amplitude: 2.5 V
Lead Channel Setting Pacing Pulse Width: 0.4 ms
Lead Channel Setting Sensing Sensitivity: 0.6 mV
Pulse Gen Serial Number: 755468
Zone Setting Status: 755011

## 2023-01-23 NOTE — Progress Notes (Signed)
Remote pacemaker transmission.   

## 2023-02-14 ENCOUNTER — Telehealth: Payer: Self-pay | Admitting: Cardiology

## 2023-02-14 NOTE — Telephone Encounter (Signed)
I did not need this encounter. °

## 2023-02-16 ENCOUNTER — Other Ambulatory Visit: Payer: Self-pay | Admitting: Cardiology

## 2023-02-19 DIAGNOSIS — R1030 Lower abdominal pain, unspecified: Secondary | ICD-10-CM | POA: Insufficient documentation

## 2023-02-19 HISTORY — DX: Lower abdominal pain, unspecified: R10.30

## 2023-02-20 ENCOUNTER — Ambulatory Visit: Payer: BC Managed Care – PPO | Admitting: Cardiology

## 2023-03-13 ENCOUNTER — Other Ambulatory Visit: Payer: Self-pay | Admitting: Cardiology

## 2023-03-21 ENCOUNTER — Ambulatory Visit: Payer: Medicare Other | Attending: Cardiology | Admitting: Cardiology

## 2023-03-21 ENCOUNTER — Encounter: Payer: Self-pay | Admitting: Cardiology

## 2023-03-21 VITALS — BP 112/74 | Ht 68.5 in | Wt 234.0 lb

## 2023-03-21 DIAGNOSIS — I428 Other cardiomyopathies: Secondary | ICD-10-CM | POA: Diagnosis not present

## 2023-03-21 DIAGNOSIS — I447 Left bundle-branch block, unspecified: Secondary | ICD-10-CM | POA: Diagnosis not present

## 2023-03-21 DIAGNOSIS — E785 Hyperlipidemia, unspecified: Secondary | ICD-10-CM | POA: Diagnosis not present

## 2023-03-21 DIAGNOSIS — I442 Atrioventricular block, complete: Secondary | ICD-10-CM

## 2023-03-21 NOTE — Patient Instructions (Signed)
Medication Instructions:  Your physician recommends that you continue on your current medications as directed. Please refer to the Current Medication list given to you today.  *If you need a refill on your cardiac medications before your next appointment, please call your pharmacy*   Lab Work: Your physician recommends that you return for lab work in: when fasting You need to have labs done when you are fasting.  You can come Monday through Friday 8:30 am to 12:00 pm and 1:15 to 4:30. You do not need to make an appointment as the order has already been placed. The labs you are going to have done are AST, ALT Lipids.    Testing/Procedures: Your physician has requested that you have an echocardiogram. Echocardiography is a painless test that uses sound waves to create images of your heart. It provides your doctor with information about the size and shape of your heart and how well your heart's chambers and valves are working. This procedure takes approximately one hour. There are no restrictions for this procedure. Please do NOT wear cologne, perfume, aftershave, or lotions (deodorant is allowed). Please arrive 15 minutes prior to your appointment time.    Follow-Up: At [facility], you and your health needs are our priority.  As part of our continuing mission to provide you with exceptional heart care, we have created designated Provider Care Teams.  These Care Teams include your primary Cardiologist (physician) and Advanced Practice Providers (APPs -  Physician Assistants and Nurse Practitioners) who all work together to provide you with the care you need, when you need it.  We recommend signing up for the patient portal called "MyChart".  Sign up information is provided on this After Visit Summary.  MyChart is used to connect with patients for Virtual Visits (Telemedicine).  Patients are able to view lab/test results, encounter notes, upcoming appointments, etc.  Non-urgent messages can be  sent to your provider as well.   To learn more about what you can do with MyChart, go to [email or web address].    Your next appointment:   6 month(s)  The format for your next appointment:   In Person  Provider:   Gypsy Balsam, MD    Other Instructions NA

## 2023-03-21 NOTE — Addendum Note (Signed)
Addended by: Baldo Ash D on: 03/21/2023 11:53 AM   Modules accepted: Orders

## 2023-03-21 NOTE — Progress Notes (Signed)
Cardiology Office Note:    Date:  03/21/2023   ID:  Seth Schneider, DOB August 26, 1956, MRN [id number]  PCP:  Seth Bass, MD  Cardiologist:  Gypsy Balsam, MD    Referring MD: Seth Bass, MD   Chief Complaint  Patient presents with   Follow-up  Doing well  History of Present Illness:    Seth Schneider is a 67 y.o. male past medical history significant for left bundle branch block, ejection fraction 4045%, nonischemic, history of sick sinus syndrome status post dual pacemaker implantation which is Research officer, political party.  Dyslipidemia. Comes today to months for follow-up overall he is doing very well.  Denies have any chest pain tightness squeezing pressure manage no dizziness  Past Medical History:  Diagnosis Date   Cardiomyopathy (HCC) 07/12/2015   Overview:  Overview:  Normalization of ejection fraction 2017 Overview:  Normalization of ejection fraction 2017   Chronic kidney disease, stage 3 (HCC) 04/30/2018   2019: 43   Complete heart block (HCC)    Constipation 06/09/2020   Formatting of this note might be different from the original. 06/09/2020   Diverticulosis of colon 04/25/2017   Formatting of this note might be different from the original. 2012: diverticulitis   Dyslipidemia 07/12/2015   Essential hypertension 07/12/2015   Gout 04/29/2018   Formatting of this note might be different from the original. 2019: left first MTPJ, 8.6   LBBB (left bundle branch block) 12/17/2018   Mixed hyperlipidemia 04/25/2017   Nonischemic cardiomyopathy (HCC)    Normal coronary arteries 12/17/2018   Pacemaker 07/12/2015   SSS (sick sinus syndrome) (HCC) 11/01/2015   Ventricular tachycardia (HCC) 04/25/2017    Past Surgical History:  Procedure Laterality Date   INSERT / REPLACE / REMOVE PACEMAKER     [place]   UMBILICAL HERNIA REPAIR     VASECTOMY      Current Medications: Current Meds  Medication Sig   aspirin EC 81 MG tablet Take 1 tablet by mouth daily.    carvedilol  (COREG) 25 MG tablet Take 1 tablet (25 mg total) by mouth 2 (two) times daily.   rosuvastatin (CRESTOR) 20 MG tablet Take 1 tablet by mouth once daily   sacubitril-valsartan (ENTRESTO) 49-51 MG Take 1 tablet by mouth 2 (two) times daily.     Allergies:   Patient has no known allergies.   Social History   Socioeconomic History   Marital status: Married    Spouse name: Not on file   Number of children: Not on file   Years of education: Not on file   Highest education level: Not on file  Occupational History   Not on file  Tobacco Use   Smoking status: Never   Smokeless tobacco: Never  Vaping Use   Vaping status: Never Used  Substance and Sexual Activity   Alcohol use: Not Currently   Drug use: Never   Sexual activity: Not on file  Other Topics Concern   Not on file  Social History Narrative   Not on file   Social Determinants of Health   Financial Resource Strain: Not on file  Food Insecurity: Not on file  Transportation Needs: Not on file  Physical Activity: Not on file  Stress: Not on file  Social Connections: Not on file     Family History: The patient's family history includes Anxiety disorder in his mother; Hypertension in his mother; Parkinson's disease in his father; Thyroid disease in his mother. ROS:   Please see  the history of present illness.    All 14 point review of systems negative except as described per history of present illness  EKGs/Labs/Other Studies Reviewed:         Recent Labs: No results found for requested labs within last 365 days.  Recent Lipid Panel    Component Value Date/Time   CHOL 170 06/15/2021 1000   TRIG 175 (H) 06/15/2021 1000   HDL 45 06/15/2021 1000   CHOLHDL 3.8 06/15/2021 1000   LDLCALC 95 06/15/2021 1000    Physical Exam:    VS:  BP 112/74 (BP Location: Left Arm, Patient Position: Sitting)   Ht 5' 8.5" (1.74 m)   Wt 234 lb (106.1 kg)   SpO2 91%   BMI 35.06 kg/m     Wt Readings from Last 3 Encounters:   03/21/23 234 lb (106.1 kg)  08/20/22 236 lb 9.6 oz (107.3 kg)  02/20/22 235 lb 6.4 oz (106.8 kg)     GEN:  Well nourished, well developed in no acute distress HEENT: Normal NECK: No JVD; No carotid bruits LYMPHATICS: No lymphadenopathy CARDIAC: RRR, no murmurs, no rubs, no gallops RESPIRATORY:  Clear to auscultation without rales, wheezing or rhonchi  ABDOMEN: Soft, non-tender, non-distended MUSCULOSKELETAL:  No edema; No deformity  SKIN: Warm and dry LOWER EXTREMITIES: no swelling NEUROLOGIC:  Alert and oriented x 3 PSYCHIATRIC:  Normal affect   ASSESSMENT:    1. Nonischemic cardiomyopathy (HCC)   2. LBBB (left bundle branch block)   3. Complete heart block (HCC)   4. Dyslipidemia    PLAN:    In order of problems listed above:  Nonischemic cardiomyopathy on maximally tolerated guideline medical therapy.  Will repeat his echocardiogram to recheck left ventricle ejection fraction. Left bundle branch block chronic problem.  If there is deterioration of left ventricle ejection fraction will think about potentially upgrading his device to BiV pacer. Dyslipidemia will check his fasting lipid profile today.  I did review K PN which show me data from 2022 with LDL 95 HDL 45   Medication Adjustments/Labs and Tests Ordered: Current medicines are reviewed at length with the patient today.  Concerns regarding medicines are outlined above.  No orders of the defined types were placed in this encounter.  Medication changes: No orders of the defined types were placed in this encounter.   Signed, Georgeanna Lea, MD, [facility] 03/21/2023 11:39 AM    [facility]

## 2023-04-02 ENCOUNTER — Ambulatory Visit (INDEPENDENT_AMBULATORY_CARE_PROVIDER_SITE_OTHER): Payer: Medicare Other

## 2023-04-02 DIAGNOSIS — I442 Atrioventricular block, complete: Secondary | ICD-10-CM

## 2023-04-02 DIAGNOSIS — I495 Sick sinus syndrome: Secondary | ICD-10-CM

## 2023-04-02 LAB — CUP PACEART REMOTE DEVICE CHECK
Battery Remaining Longevity: 96 mo
Battery Remaining Percentage: 92 %
Brady Statistic RA Percent Paced: 30 %
Brady Statistic RV Percent Paced: 0 %
Date Time Interrogation Session: 20240730020100
Implantable Lead Connection Status: 753985
Implantable Lead Connection Status: 753985
Implantable Lead Implant Date: 20031201
Implantable Lead Implant Date: 20031201
Implantable Lead Location: 753859
Implantable Lead Location: 753860
Implantable Lead Model: 4470
Implantable Lead Model: 5076
Implantable Lead Serial Number: 371531
Implantable Pulse Generator Implant Date: 20170629
Lead Channel Impedance Value: 396 Ohm
Lead Channel Impedance Value: 430 Ohm
Lead Channel Pacing Threshold Amplitude: 0.4 V
Lead Channel Pacing Threshold Amplitude: 1.2 V
Lead Channel Pacing Threshold Pulse Width: 0.4 ms
Lead Channel Pacing Threshold Pulse Width: 0.4 ms
Lead Channel Setting Pacing Amplitude: 2 V
Lead Channel Setting Pacing Amplitude: 2.5 V
Lead Channel Setting Pacing Pulse Width: 0.4 ms
Lead Channel Setting Sensing Sensitivity: 0.6 mV
Pulse Gen Serial Number: 755468
Zone Setting Status: 755011

## 2023-04-18 ENCOUNTER — Ambulatory Visit: Payer: Medicare Other

## 2023-04-19 ENCOUNTER — Other Ambulatory Visit: Payer: Self-pay | Admitting: Cardiology

## 2023-04-22 ENCOUNTER — Telehealth: Payer: Self-pay

## 2023-04-22 NOTE — Telephone Encounter (Signed)
Left message on My Chart with normal lab results per Dr. Krasowski's note. Routed to PCP.  

## 2023-04-22 NOTE — Telephone Encounter (Signed)
Pt viewed results in My Chart per Dr. Krasowski's note. Routed to PCP.  

## 2023-04-24 NOTE — Progress Notes (Signed)
Remote pacemaker transmission.   

## 2023-05-22 ENCOUNTER — Ambulatory Visit: Payer: Medicare Other | Attending: Cardiology

## 2023-05-22 DIAGNOSIS — I428 Other cardiomyopathies: Secondary | ICD-10-CM

## 2023-05-22 LAB — ECHOCARDIOGRAM COMPLETE
Area-P 1/2: 3.62 cm2
S' Lateral: 3.8 cm

## 2023-05-31 ENCOUNTER — Telehealth: Payer: Self-pay

## 2023-05-31 NOTE — Telephone Encounter (Signed)
-----   Message from Gypsy Balsam sent at 05/24/2023 10:14 AM EDT ----- Echocardiogram showed low normal ejection fraction overall looks.

## 2023-05-31 NOTE — Telephone Encounter (Signed)
Patient notified through my chart.

## 2023-06-15 ENCOUNTER — Other Ambulatory Visit: Payer: Self-pay | Admitting: Cardiology

## 2023-07-02 ENCOUNTER — Ambulatory Visit (INDEPENDENT_AMBULATORY_CARE_PROVIDER_SITE_OTHER): Payer: Medicare Other

## 2023-07-02 DIAGNOSIS — I442 Atrioventricular block, complete: Secondary | ICD-10-CM | POA: Diagnosis not present

## 2023-07-04 DIAGNOSIS — B009 Herpesviral infection, unspecified: Secondary | ICD-10-CM

## 2023-07-04 HISTORY — DX: Herpesviral infection, unspecified: B00.9

## 2023-07-04 LAB — CUP PACEART REMOTE DEVICE CHECK
Battery Remaining Longevity: 90 mo
Battery Remaining Percentage: 90 %
Brady Statistic RA Percent Paced: 30 %
Brady Statistic RV Percent Paced: 0 %
Date Time Interrogation Session: 20241029020100
Implantable Lead Connection Status: 753985
Implantable Lead Connection Status: 753985
Implantable Lead Implant Date: 20031201
Implantable Lead Implant Date: 20031201
Implantable Lead Location: 753859
Implantable Lead Location: 753860
Implantable Lead Model: 4470
Implantable Lead Model: 5076
Implantable Lead Serial Number: 371531
Implantable Pulse Generator Implant Date: 20170629
Lead Channel Impedance Value: 417 Ohm
Lead Channel Impedance Value: 432 Ohm
Lead Channel Pacing Threshold Amplitude: 0.4 V
Lead Channel Pacing Threshold Amplitude: 1.2 V
Lead Channel Pacing Threshold Pulse Width: 0.4 ms
Lead Channel Pacing Threshold Pulse Width: 0.4 ms
Lead Channel Setting Pacing Amplitude: 2 V
Lead Channel Setting Pacing Amplitude: 2.5 V
Lead Channel Setting Pacing Pulse Width: 0.4 ms
Lead Channel Setting Sensing Sensitivity: 0.6 mV
Pulse Gen Serial Number: 755468
Zone Setting Status: 755011

## 2023-07-15 DIAGNOSIS — Z2882 Immunization not carried out because of caregiver refusal: Secondary | ICD-10-CM | POA: Insufficient documentation

## 2023-07-15 DIAGNOSIS — E66811 Obesity, class 1: Secondary | ICD-10-CM

## 2023-07-15 DIAGNOSIS — E6609 Other obesity due to excess calories: Secondary | ICD-10-CM | POA: Insufficient documentation

## 2023-07-15 HISTORY — DX: Obesity, class 1: E66.811

## 2023-07-15 HISTORY — DX: Immunization not carried out because of caregiver refusal: Z28.82

## 2023-07-22 ENCOUNTER — Other Ambulatory Visit: Payer: Self-pay | Admitting: Cardiology

## 2023-07-22 NOTE — Progress Notes (Signed)
Remote pacemaker transmission.   

## 2023-08-12 ENCOUNTER — Telehealth: Payer: Self-pay | Admitting: Cardiology

## 2023-08-12 NOTE — Telephone Encounter (Signed)
Pt c/o medication issue:  1. Name of Medication:   sacubitril-valsartan (ENTRESTO) 49-51 MG   2. How are you currently taking this medication (dosage and times per day)?   As prescribed  3. Are you having a reaction (difficulty breathing--STAT)?   No  4. What is your medication issue?   Wife [name]) stated patient wants to get alternate medication that is less expensive.

## 2023-08-15 ENCOUNTER — Other Ambulatory Visit: Payer: Self-pay

## 2023-08-15 MED ORDER — ENTRESTO 49-51 MG PO TABS: 1.0000 | ORAL_TABLET | Freq: Two times a day (BID) | ORAL | Status: DC

## 2023-08-15 NOTE — Telephone Encounter (Signed)
Spoke with [name], aware the application drop off was not sign. [name] also is aware samples available for pick up per pt request.

## 2023-08-20 NOTE — Telephone Encounter (Signed)
Seth Schneider states that they have completed the pt assistance paperwork and are waiting to hear the outcome.

## 2023-08-21 ENCOUNTER — Telehealth: Payer: Self-pay

## 2023-08-21 NOTE — Telephone Encounter (Signed)
Per [employer], patient need to apply for the Extra Help Program first, if denied then the foundation will reopen his case with the denial letter. I call and LM and LM on my chart with details.

## 2023-08-22 ENCOUNTER — Telehealth: Payer: Self-pay | Admitting: Cardiology

## 2023-08-22 NOTE — Telephone Encounter (Signed)
Please call and explain how to complete the Extra Help program.

## 2023-08-22 NOTE — Telephone Encounter (Signed)
Pt returning nurses phone call. Please advise ?

## 2023-08-23 NOTE — Telephone Encounter (Signed)
Spoke with Tinnie Gens, notified of the following per Dr. Bing Matter. Patient will discuss this with his wife and call me back with his decision.

## 2023-09-30 ENCOUNTER — Ambulatory Visit: Payer: Medicare Other | Admitting: Cardiology

## 2023-09-30 ENCOUNTER — Ambulatory Visit: Payer: Medicare Other | Attending: Cardiology | Admitting: Cardiology

## 2023-09-30 VITALS — BP 108/70 | HR 62 | Ht 68.5 in | Wt 234.0 lb

## 2023-09-30 DIAGNOSIS — I442 Atrioventricular block, complete: Secondary | ICD-10-CM

## 2023-09-30 DIAGNOSIS — I447 Left bundle-branch block, unspecified: Secondary | ICD-10-CM

## 2023-09-30 DIAGNOSIS — E785 Hyperlipidemia, unspecified: Secondary | ICD-10-CM

## 2023-09-30 DIAGNOSIS — I10 Essential (primary) hypertension: Secondary | ICD-10-CM

## 2023-09-30 DIAGNOSIS — I420 Dilated cardiomyopathy: Secondary | ICD-10-CM

## 2023-09-30 MED ORDER — ENTRESTO 49-51 MG PO TABS: 1.0000 | ORAL_TABLET | Freq: Two times a day (BID) | ORAL | 5 refills | Status: AC

## 2023-09-30 NOTE — Progress Notes (Signed)
Cardiology Office Note:    Date:  09/30/2023   ID:  Seth Schneider, DOB 06/18/1956, MRN [id number]  PCP:  Olive Bass, MD  Cardiologist:  Gypsy Balsam, MD    Referring MD: Olive Bass, MD   Chief Complaint  Patient presents with   Follow-up    History of Present Illness:    Seth Schneider is a 68 y.o. male past medical history significant for left bundle branch show ejection fraction 50 to 55%, sick sinus syndrome, status post dual-chamber pacemaker implantation which is Research officer, political party.  Comes today to my office for follow-up overall doing great.  Denies have any chest pain tightness squeezing pressure burning chest.  Can do what he wants to do without difficulties  Past Medical History:  Diagnosis Date   Cardiomyopathy (HCC) 07/12/2015   Overview:  Overview:  Normalization of ejection fraction 2017 Overview:  Normalization of ejection fraction 2017   Chronic kidney disease, stage 3 (HCC) 04/30/2018   2019: 43   Complete heart block (HCC)    Constipation 06/09/2020   Formatting of this note might be different from the original. 06/09/2020   Diverticulosis of colon 04/25/2017   Formatting of this note might be different from the original. 2012: diverticulitis   Dyslipidemia 07/12/2015   Essential hypertension 07/12/2015   Gout 04/29/2018   Formatting of this note might be different from the original. 2019: left first MTPJ, 8.6   LBBB (left bundle branch block) 12/17/2018   Mixed hyperlipidemia 04/25/2017   Nonischemic cardiomyopathy (HCC)    Normal coronary arteries 12/17/2018   Pacemaker 07/12/2015   SSS (sick sinus syndrome) (HCC) 11/01/2015   Ventricular tachycardia (HCC) 04/25/2017    Past Surgical History:  Procedure Laterality Date   INSERT / REPLACE / REMOVE PACEMAKER     [place]   UMBILICAL HERNIA REPAIR     VASECTOMY      Current Medications: Current Meds  Medication Sig   aspirin EC 81 MG tablet Take 1 tablet by mouth daily.    carvedilol  (COREG) 25 MG tablet Take 1 tablet (25 mg total) by mouth 2 (two) times daily.   rosuvastatin (CRESTOR) 20 MG tablet Take 1 tablet by mouth once daily   sacubitril-valsartan (ENTRESTO) 49-51 MG Take 1 tablet by mouth 2 (two) times daily.     Allergies:   Patient has no known allergies.   Social History   Socioeconomic History   Marital status: Married    Spouse name: Not on file   Number of children: Not on file   Years of education: Not on file   Highest education level: Not on file  Occupational History   Not on file  Tobacco Use   Smoking status: Never   Smokeless tobacco: Never  Vaping Use   Vaping status: Never Used  Substance and Sexual Activity   Alcohol use: Not Currently   Drug use: Never   Sexual activity: Not on file  Other Topics Concern   Not on file  Social History Narrative   Not on file   Social Drivers of Health   Financial Resource Strain: Not on file  Food Insecurity: Low Risk  (07/15/2023)   Received from [facility]   Hunger Vital Sign    Worried About Running Out of Food in the Last Year: Never true    Ran Out of Food in the Last Year: Never true  Transportation Needs: No Transportation Needs (07/15/2023)   Received from [facility]  Transportation    In the past 12 months, has lack of reliable transportation kept you from medical appointments, meetings, work or from getting things needed for daily living? : No  Physical Activity: Not on file  Stress: Not on file  Social Connections: Not on file     Family History: The patient's family history includes Anxiety disorder in his mother; Hypertension in his mother; Parkinson's disease in his father; Thyroid disease in his mother. ROS:   Please see the history of present illness.    All 14 point review of systems negative except as described per history of present illness  EKGs/Labs/Other Studies Reviewed:    EKG Interpretation Date/Time:  Monday September 30 2023 09:44:49 EST Ventricular  Rate:  65 PR Interval:  204 QRS Duration:  176 QT Interval:  444 QTC Calculation: 461 R Axis:   -52  Text Interpretation: Normal sinus rhythm Left axis deviation Left bundle branch block Abnormal ECG No previous ECGs available Confirmed by Gypsy Balsam [phone]) on 09/30/2023 9:59:34 AM    Recent Labs: 04/18/2023: ALT 21  Recent Lipid Panel    Component Value Date/Time   CHOL 167 04/18/2023 1008   TRIG 222 (H) 04/18/2023 1008   HDL 42 04/18/2023 1008   CHOLHDL 4.0 04/18/2023 1008   LDLCALC 88 04/18/2023 1008    Physical Exam:    VS:  BP 108/70 (BP Location: Right Arm, Patient Position: Sitting)   Pulse 62   Ht 5' 8.5" (1.74 m)   Wt 234 lb (106.1 kg)   SpO2 93%   BMI 35.06 kg/m     Wt Readings from Last 3 Encounters:  09/30/23 234 lb (106.1 kg)  03/21/23 234 lb (106.1 kg)  08/20/22 236 lb 9.6 oz (107.3 kg)     GEN:  Well nourished, well developed in no acute distress HEENT: Normal NECK: No JVD; No carotid bruits LYMPHATICS: No lymphadenopathy CARDIAC: RRR, no murmurs, no rubs, no gallops RESPIRATORY:  Clear to auscultation without rales, wheezing or rhonchi  ABDOMEN: Soft, non-tender, non-distended MUSCULOSKELETAL:  No edema; No deformity  SKIN: Warm and dry LOWER EXTREMITIES: no swelling NEUROLOGIC:  Alert and oriented x 3 PSYCHIATRIC:  Normal affect   ASSESSMENT:    1. Essential hypertension   2. LBBB (left bundle branch block)   3. Complete heart block (HCC)   4. Dilated cardiomyopathy (HCC)   5. Dyslipidemia    PLAN:    In order of problems listed above:  Essential hypertension: Blood pressure well-controlled continue present management. Left bundle branch which is chronic continue monitoring. History of cardiomyopathy but last echocardiogram ejection fraction 50 to 55% we will repeat echocardiogram a year after last echocardiogram has been done.  Also ask him to warn me if he develops shortness of breath chest pain tightness squeezing pressure  burning chest. Dyslipidemia I did review K PN LDL 88 HDL 42 continue watching   Medication Adjustments/Labs and Tests Ordered: Current medicines are reviewed at length with the patient today.  Concerns regarding medicines are outlined above.  Orders Placed This Encounter  Procedures   EKG 12-Lead   Medication changes: No orders of the defined types were placed in this encounter.   Signed, Georgeanna Lea, MD, [facility] 09/30/2023 10:10 AM    [facility]

## 2023-09-30 NOTE — Addendum Note (Signed)
Addended by: Lonia Farber on: 09/30/2023 10:19 AM   Modules accepted: Orders

## 2023-09-30 NOTE — Addendum Note (Signed)
Addended by: Lonia Farber on: 09/30/2023 10:14 AM   Modules accepted: Orders

## 2023-09-30 NOTE — Patient Instructions (Signed)
 Medication Instructions:  Your physician recommends that you continue on your current medications as directed. Please refer to the Current Medication list given to you today.  *If you need a refill on your cardiac medications before your next appointment, please call your pharmacy*   Lab Work: None Ordered If you have labs (blood work) drawn today and your tests are completely normal, you will receive your results only by: MyChart Message (if you have MyChart) OR A paper copy in the mail If you have any lab test that is abnormal or we need to change your treatment, we will call you to review the results.   Testing/Procedures: Echocardiogram An echocardiogram is a test that uses sound waves (ultrasound) to produce images of the heart. Images from an echocardiogram can provide important information about: Heart size and shape. The size and thickness and movement of your heart's walls. Heart muscle function and strength. Heart valve function or if you have stenosis. Stenosis is when the heart valves are too narrow. If blood is flowing backward through the heart valves (regurgitation). A tumor or infectious growth around the heart valves. Areas of heart muscle that are not working well because of poor blood flow or injury from a heart attack. Aneurysm detection. An aneurysm is a weak or damaged part of an artery wall. The wall bulges out from the normal force of blood pumping through the body. Tell a health care provider about: Any allergies you have. All medicines you are taking, including vitamins, herbs, eye drops, creams, and over-the-counter medicines. Any blood disorders you have. Any surgeries you have had. Any medical conditions you have. Whether you are pregnant or may be pregnant. What are the risks? Generally, this is a safe test. However, problems may occur, including an allergic reaction to dye (contrast) that may be used during the test. What happens before the test? No  specific preparation is needed. You may eat and drink normally. What happens during the test?  You will take off your clothes from the waist up and put on a hospital gown. Electrodes or electrocardiogram (ECG)patches may be placed on your chest. The electrodes or patches are then connected to a device that monitors your heart rate and rhythm. You will lie down on a table for an ultrasound exam. A gel will be applied to your chest to help sound waves pass through your skin. A handheld device, called a transducer, will be pressed against your chest and moved over your heart. The transducer produces sound waves that travel to your heart and bounce back (or "echo" back) to the transducer. These sound waves will be captured in real-time and changed into images of your heart that can be viewed on a video monitor. The images will be recorded on a computer and reviewed by your health care provider. You may be asked to change positions or hold your breath for a short time. This makes it easier to get different views or better views of your heart. In some cases, you may receive contrast through an IV in one of your veins. This can improve the quality of the pictures from your heart. The procedure may vary among health care providers and hospitals. What can I expect after the test? You may return to your normal, everyday life, including diet, activities, and medicines, unless your health care provider tells you not to do that. Follow these instructions at home: It is up to you to get the results of your test. Ask your health care provider,  or the department that is doing the test, when your results will be ready. Keep all follow-up visits. This is important. Summary An echocardiogram is a test that uses sound waves (ultrasound) to produce images of the heart. Images from an echocardiogram can provide important information about the size and shape of your heart, heart muscle function, heart valve function, and  other possible heart problems. You do not need to do anything to prepare before this test. You may eat and drink normally. After the echocardiogram is completed, you may return to your normal, everyday life, unless your health care provider tells you not to do that. This information is not intended to replace advice given to you by your health care provider. Make sure you discuss any questions you have with your health care provider. Document Revised: 05/03/2021 Document Reviewed: 04/12/2020 Elsevier Patient Education  2023 Elsevier Inc.        Follow-Up: At [facility], you and your health needs are our priority.  As part of our continuing mission to provide you with exceptional heart care, we have created designated Provider Care Teams.  These Care Teams include your primary Cardiologist (physician) and Advanced Practice Providers (APPs -  Physician Assistants and Nurse Practitioners) who all work together to provide you with the care you need, when you need it.  We recommend signing up for the patient portal called "MyChart".  Sign up information is provided on this After Visit Summary.  MyChart is used to connect with patients for Virtual Visits (Telemedicine).  Patients are able to view lab/test results, encounter notes, upcoming appointments, etc.  Non-urgent messages can be sent to your provider as well.   To learn more about what you can do with MyChart, go to [email or web address].    Your next appointment:    1 year follow up

## 2023-10-01 ENCOUNTER — Ambulatory Visit (INDEPENDENT_AMBULATORY_CARE_PROVIDER_SITE_OTHER): Payer: BC Managed Care – PPO

## 2023-10-01 DIAGNOSIS — I442 Atrioventricular block, complete: Secondary | ICD-10-CM | POA: Diagnosis not present

## 2023-10-01 DIAGNOSIS — I495 Sick sinus syndrome: Secondary | ICD-10-CM

## 2023-10-01 LAB — CUP PACEART REMOTE DEVICE CHECK
Battery Remaining Longevity: 90 mo
Battery Remaining Percentage: 86 %
Brady Statistic RA Percent Paced: 29 %
Brady Statistic RV Percent Paced: 0 %
Date Time Interrogation Session: 20250128021400
Implantable Lead Connection Status: 753985
Implantable Lead Connection Status: 753985
Implantable Lead Implant Date: 20031201
Implantable Lead Implant Date: 20031201
Implantable Lead Location: 753859
Implantable Lead Location: 753860
Implantable Lead Model: 4470
Implantable Lead Model: 5076
Implantable Lead Serial Number: 371531
Implantable Pulse Generator Implant Date: 20170629
Lead Channel Impedance Value: 400 Ohm
Lead Channel Impedance Value: 411 Ohm
Lead Channel Pacing Threshold Amplitude: 0.4 V
Lead Channel Pacing Threshold Amplitude: 1.2 V
Lead Channel Pacing Threshold Pulse Width: 0.4 ms
Lead Channel Pacing Threshold Pulse Width: 0.4 ms
Lead Channel Setting Pacing Amplitude: 2 V
Lead Channel Setting Pacing Amplitude: 2.5 V
Lead Channel Setting Pacing Pulse Width: 0.4 ms
Lead Channel Setting Sensing Sensitivity: 0.6 mV
Pulse Gen Serial Number: 755468
Zone Setting Status: 755011

## 2023-10-18 ENCOUNTER — Telehealth: Payer: Self-pay

## 2023-10-18 NOTE — Telephone Encounter (Signed)
Denial letter for the Extra Help Program faxed to [employer], requesting to reopen the patient case.

## 2023-11-11 NOTE — Progress Notes (Signed)
 Remote pacemaker transmission.

## 2023-12-31 ENCOUNTER — Ambulatory Visit (INDEPENDENT_AMBULATORY_CARE_PROVIDER_SITE_OTHER): Payer: BC Managed Care – PPO

## 2023-12-31 DIAGNOSIS — I442 Atrioventricular block, complete: Secondary | ICD-10-CM

## 2023-12-31 LAB — CUP PACEART REMOTE DEVICE CHECK
Battery Remaining Longevity: 90 mo
Battery Remaining Percentage: 86 %
Brady Statistic RA Percent Paced: 28 %
Brady Statistic RV Percent Paced: 0 %
Date Time Interrogation Session: 20250429020200
Implantable Lead Connection Status: 753985
Implantable Lead Connection Status: 753985
Implantable Lead Implant Date: 20031201
Implantable Lead Implant Date: 20031201
Implantable Lead Location: 753859
Implantable Lead Location: 753860
Implantable Lead Model: 4470
Implantable Lead Model: 5076
Implantable Lead Serial Number: 371531
Implantable Pulse Generator Implant Date: 20170629
Lead Channel Impedance Value: 391 Ohm
Lead Channel Impedance Value: 428 Ohm
Lead Channel Pacing Threshold Amplitude: 0.4 V
Lead Channel Pacing Threshold Amplitude: 1.3 V
Lead Channel Pacing Threshold Pulse Width: 0.4 ms
Lead Channel Pacing Threshold Pulse Width: 0.4 ms
Lead Channel Setting Pacing Amplitude: 2 V
Lead Channel Setting Pacing Amplitude: 2.5 V
Lead Channel Setting Pacing Pulse Width: 0.4 ms
Lead Channel Setting Sensing Sensitivity: 0.6 mV
Pulse Gen Serial Number: 755468
Zone Setting Status: 755011

## 2024-01-20 ENCOUNTER — Other Ambulatory Visit: Payer: Self-pay | Admitting: Cardiology

## 2024-02-14 NOTE — Progress Notes (Signed)
 Remote pacemaker transmission.

## 2024-03-03 ENCOUNTER — Other Ambulatory Visit: Payer: Self-pay | Admitting: Cardiology

## 2024-03-04 ENCOUNTER — Other Ambulatory Visit: Payer: Self-pay | Admitting: Cardiology

## 2024-03-05 ENCOUNTER — Telehealth: Payer: Self-pay | Admitting: Cardiology

## 2024-03-05 MED ORDER — ROSUVASTATIN CALCIUM 20 MG PO TABS: 20.0000 mg | ORAL_TABLET | Freq: Every day | ORAL | 1 refills | Status: AC

## 2024-03-05 NOTE — Telephone Encounter (Signed)
 Pt's medication was resent to pt's pharmacy as requested. Confirmation received.

## 2024-03-05 NOTE — Telephone Encounter (Signed)
*  STAT* If patient is at the pharmacy, call can be transferred to refill team.   1. Which medications need to be refilled? (please list name of each medication and dose if known)   rosuvastatin  (CRESTOR ) 20 MG tablet   2. Would you like to learn more about the convenience, safety, & potential cost savings by using the [facility]?   3. Are you open to using the [facility] (Type [facility]. ).  4. Which pharmacy/location (including street and city if local pharmacy) is medication to be sent to?  [facility] - [place], [place] - [address]   5. Do they need a 30 day or 90 day supply?   Wife Elray) stated pharmacy has not received the refill request and wants the refill request resent to [facility] [address] [place] - [address].  Wife stated patient still has some medication.

## 2024-03-31 ENCOUNTER — Ambulatory Visit (INDEPENDENT_AMBULATORY_CARE_PROVIDER_SITE_OTHER): Payer: BC Managed Care – PPO

## 2024-03-31 DIAGNOSIS — I442 Atrioventricular block, complete: Secondary | ICD-10-CM

## 2024-03-31 LAB — CUP PACEART REMOTE DEVICE CHECK
Battery Remaining Longevity: 78 mo
Battery Remaining Percentage: 78 %
Brady Statistic RA Percent Paced: 28 %
Brady Statistic RV Percent Paced: 1 %
Date Time Interrogation Session: 20250729020100
Implantable Lead Connection Status: 753985
Implantable Lead Connection Status: 753985
Implantable Lead Implant Date: 20031201
Implantable Lead Implant Date: 20031201
Implantable Lead Location: 753859
Implantable Lead Location: 753860
Implantable Lead Model: 4470
Implantable Lead Model: 5076
Implantable Lead Serial Number: 371531
Implantable Pulse Generator Implant Date: 20170629
Lead Channel Impedance Value: 389 Ohm
Lead Channel Impedance Value: 403 Ohm
Lead Channel Pacing Threshold Amplitude: 0.4 V
Lead Channel Pacing Threshold Amplitude: 1.3 V
Lead Channel Pacing Threshold Pulse Width: 0.4 ms
Lead Channel Pacing Threshold Pulse Width: 0.4 ms
Lead Channel Setting Pacing Amplitude: 2 V
Lead Channel Setting Pacing Amplitude: 2.5 V
Lead Channel Setting Pacing Pulse Width: 0.4 ms
Lead Channel Setting Sensing Sensitivity: 0.6 mV
Pulse Gen Serial Number: 755468
Zone Setting Status: 755011

## 2024-04-01 ENCOUNTER — Ambulatory Visit: Payer: Self-pay | Admitting: Cardiology

## 2024-05-21 ENCOUNTER — Ambulatory Visit: Attending: Cardiology

## 2024-05-21 DIAGNOSIS — I420 Dilated cardiomyopathy: Secondary | ICD-10-CM | POA: Diagnosis not present

## 2024-05-21 LAB — ECHOCARDIOGRAM COMPLETE
Area-P 1/2: 3.16 cm2
S' Lateral: 3 cm

## 2024-05-26 ENCOUNTER — Ambulatory Visit: Payer: Self-pay | Admitting: Cardiology

## 2024-05-28 ENCOUNTER — Telehealth: Payer: Self-pay

## 2024-05-28 NOTE — Telephone Encounter (Signed)
 Pt viewed Echo results on My Chart per Dr. Vanetta Shawl note. Routed to PCP.

## 2024-05-28 NOTE — Telephone Encounter (Signed)
 Left message on My Chart with Echo results per Dr. Karry note. Routed to PCP.

## 2024-06-03 NOTE — Progress Notes (Signed)
 Remote PPM Transmission

## 2024-06-30 ENCOUNTER — Ambulatory Visit: Payer: BC Managed Care – PPO

## 2024-06-30 DIAGNOSIS — I495 Sick sinus syndrome: Secondary | ICD-10-CM | POA: Diagnosis not present

## 2024-06-30 LAB — CUP PACEART REMOTE DEVICE CHECK
Battery Remaining Longevity: 78 mo
Battery Remaining Percentage: 77 %
Brady Statistic RA Percent Paced: 28 %
Brady Statistic RV Percent Paced: 1 %
Date Time Interrogation Session: 20251028020100
Implantable Lead Connection Status: 753985
Implantable Lead Connection Status: 753985
Implantable Lead Implant Date: 20031201
Implantable Lead Implant Date: 20031201
Implantable Lead Location: 753859
Implantable Lead Location: 753860
Implantable Lead Model: 4470
Implantable Lead Model: 5076
Implantable Lead Serial Number: 371531
Implantable Pulse Generator Implant Date: 20170629
Lead Channel Impedance Value: 406 Ohm
Lead Channel Impedance Value: 420 Ohm
Lead Channel Pacing Threshold Amplitude: 0.4 V
Lead Channel Pacing Threshold Amplitude: 1.3 V
Lead Channel Pacing Threshold Pulse Width: 0.4 ms
Lead Channel Pacing Threshold Pulse Width: 0.4 ms
Lead Channel Setting Pacing Amplitude: 2 V
Lead Channel Setting Pacing Amplitude: 2.5 V
Lead Channel Setting Pacing Pulse Width: 0.4 ms
Lead Channel Setting Sensing Sensitivity: 0.6 mV
Pulse Gen Serial Number: 755468
Zone Setting Status: 755011

## 2024-07-01 ENCOUNTER — Ambulatory Visit: Payer: Self-pay | Admitting: Cardiology

## 2024-07-07 NOTE — Progress Notes (Signed)
 Remote PPM Transmission

## 2024-07-20 ENCOUNTER — Other Ambulatory Visit: Payer: Self-pay | Admitting: Cardiology

## 2024-09-29 ENCOUNTER — Ambulatory Visit: Attending: Cardiology

## 2024-09-29 DIAGNOSIS — I495 Sick sinus syndrome: Secondary | ICD-10-CM

## 2024-09-30 LAB — CUP PACEART REMOTE DEVICE CHECK
Battery Remaining Longevity: 78 mo
Battery Remaining Percentage: 74 %
Brady Statistic RA Percent Paced: 28 %
Brady Statistic RV Percent Paced: 1 %
Date Time Interrogation Session: 20260127020100
Implantable Lead Connection Status: 753985
Implantable Lead Connection Status: 753985
Implantable Lead Implant Date: 20031201
Implantable Lead Implant Date: 20031201
Implantable Lead Location: 753859
Implantable Lead Location: 753860
Implantable Lead Model: 4470
Implantable Lead Model: 5076
Implantable Lead Serial Number: 371531
Implantable Pulse Generator Implant Date: 20170629
Lead Channel Impedance Value: 402 Ohm
Lead Channel Impedance Value: 424 Ohm
Lead Channel Pacing Threshold Amplitude: 0.4 V
Lead Channel Pacing Threshold Amplitude: 1.4 V
Lead Channel Pacing Threshold Pulse Width: 0.4 ms
Lead Channel Pacing Threshold Pulse Width: 0.4 ms
Lead Channel Setting Pacing Amplitude: 2 V
Lead Channel Setting Pacing Amplitude: 2.5 V
Lead Channel Setting Pacing Pulse Width: 0.4 ms
Lead Channel Setting Sensing Sensitivity: 0.6 mV
Pulse Gen Serial Number: 755468
Zone Setting Status: 755011

## 2024-10-01 ENCOUNTER — Ambulatory Visit: Payer: Self-pay | Admitting: Cardiology

## 2024-10-01 ENCOUNTER — Ambulatory Visit: Attending: Cardiology | Admitting: Cardiology

## 2024-10-01 ENCOUNTER — Encounter: Payer: Self-pay | Admitting: Cardiology

## 2024-10-01 VITALS — BP 94/62 | HR 69 | Ht 68.5 in | Wt 228.0 lb

## 2024-10-01 DIAGNOSIS — I447 Left bundle-branch block, unspecified: Secondary | ICD-10-CM | POA: Diagnosis not present

## 2024-10-01 DIAGNOSIS — I10 Essential (primary) hypertension: Secondary | ICD-10-CM | POA: Diagnosis not present

## 2024-10-01 DIAGNOSIS — R0683 Snoring: Secondary | ICD-10-CM

## 2024-10-01 DIAGNOSIS — R0600 Dyspnea, unspecified: Secondary | ICD-10-CM

## 2024-10-01 DIAGNOSIS — E782 Mixed hyperlipidemia: Secondary | ICD-10-CM | POA: Diagnosis not present

## 2024-10-01 DIAGNOSIS — I420 Dilated cardiomyopathy: Secondary | ICD-10-CM | POA: Diagnosis not present

## 2024-10-01 NOTE — Progress Notes (Signed)
 " Cardiology Office Note:    Date:  10/01/2024   ID:  Seth Schneider, DOB Sep 19, 1955, MRN [id number]  PCP:  Ofilia Lamar CROME, MD  Cardiologist:  Lamar Fitch, MD    Referring MD: Ofilia Lamar CROME, MD   No chief complaint on file. Doing fine but a little more short of breath  History of Present Illness:     Seth Schneider is a 69 y.o. male past medical history significant for left bundle branch which is chronic, ejection fraction previously 5055% but last echocardiogram showed improvement to normalization, sick sinus syndrome, dual-chamber pacemaker implantation [employer] device.  Comes today to months for follow-up overall he said he will be more shortness of breath while he walking outside or working in the garden he have to stop someone.  Denies have any swelling of lower extremities no proximal nocturnal dyspnea but he wakes up many times during the night with very dry mouth.  Apparently years ago he did have a sleep study done and he said he did have a diagnosis of sleep apnea but no treatment has been applied.  Past Medical History:  Diagnosis Date   Allergic rhinitis 04/25/2017   Bilateral impacted cerumen 08/13/2022   Formatting of this note might be different from the original. 08/13/2022     Cardiomyopathy (HCC) 07/12/2015   Overview:  Overview:  Normalization of ejection fraction 2017 Overview:  Normalization of ejection fraction 2017   Chronic kidney disease, stage 3 (HCC) 04/30/2018   2019: 43   Class 1 obesity due to excess calories with serious comorbidity and body mass index (BMI) of 34.0 to 34.9 in adult 07/15/2023   Complete heart block (HCC)    Constipation 06/09/2020   Formatting of this note might be different from the original. 06/09/2020   Contusion of right ankle 05/01/2021   Formatting of this note might be different from the original.  05/01/2021     Diverticulosis of colon 04/25/2017   Formatting of this note might be different from the original.  2012: diverticulitis   Dyslipidemia 07/12/2015   Essential hypertension 07/12/2015   Gout 04/29/2018   Formatting of this note might be different from the original. 2019: left first MTPJ, 8.6   Herpes simplex infection 07/04/2023   07/04/2023: tongue     History of chronic kidney disease 04/30/2018   2019: 43  07/15/2023: 71     LBBB (left bundle branch block) 12/17/2018   Lower abdominal pain 02/19/2023   Formatting of this note might be different from the original. 02/19/2023     Mixed hyperlipidemia 04/25/2017   Nonischemic cardiomyopathy (HCC)    Normal coronary arteries 12/17/2018   Pacemaker 07/12/2015   Right upper quadrant pain 02/22/2022   Formatting of this note might be different from the original. 02/22/2022     Screening for diabetes mellitus (DM) 05/01/2017   Sprain of right ankle 04/19/2021   Formatting of this note might be different from the original.  04/19/2021     SSS (sick sinus syndrome) (HCC) 11/01/2015   Tinea corporis 08/13/2022   Formatting of this note might be different from the original. 08/13/2022     Vaccination declined by parent 07/15/2023   07/15/2023: all but Tdap, discussed, management planning. Electronically signed by: Lamar CROME Ofilia, MD 07/15/2023 1:32 PM     Ventricular tachycardia (HCC) 04/25/2017   Wellness examination 04/25/2017    Past Surgical History:  Procedure Laterality Date   INSERT / REPLACE / REMOVE PACEMAKER  [place]   UMBILICAL HERNIA REPAIR     VASECTOMY      Current Medications: Active Medications[1]   Allergies:   Patient has no known allergies.   Social History   Socioeconomic History   Marital status: Married    Spouse name: Not on file   Number of children: Not on file   Years of education: Not on file   Highest education level: Not on file  Occupational History   Not on file  Tobacco Use   Smoking status: Never   Smokeless tobacco: Never  Vaping Use   Vaping status: Never Used  Substance and Sexual  Activity   Alcohol use: Not Currently   Drug use: Never   Sexual activity: Not on file  Other Topics Concern   Not on file  Social History Narrative   Not on file   Social Drivers of Health   Tobacco Use: Low Risk (10/01/2024)   Patient History    Smoking Tobacco Use: Never    Smokeless Tobacco Use: Never    Passive Exposure: Not on file  Financial Resource Strain: Not on file  Food Insecurity: Low Risk (07/17/2024)   Received from [facility]   Epic    Within the past 12 months, you worried that your food would run out before you got money to buy more: Never true    Within the past 12 months, the food you bought just didn't last and you didn't have money to get more. : Never true  Transportation Needs: No Transportation Needs (07/17/2024)   Received from [facility]    In the past 12 months, has lack of reliable transportation kept you from medical appointments, meetings, work or from getting things needed for daily living? : No  Physical Activity: Not on file  Stress: Not on file  Social Connections: Not on file  Depression (EYV7-0): Not on file  Alcohol Screen: Not on file  Housing: Low Risk (07/17/2024)   Received from [facility]   Epic    What is your living situation today?: I have a steady place to live    Think about the place you live. Do you have problems with any of the following? Choose all that apply:: None/None on this list  Utilities: Low Risk (07/17/2024)   Received from [facility]   Utilities    In the past 12 months has the electric, gas, oil, or water company threatened to shut off services in your home? : No  Health Literacy: Not on file     Family History: The patient's family history includes Anxiety disorder in his mother; Hypertension in his mother; Parkinson's disease in his father; Thyroid disease in his mother. ROS:   Please see the history of present illness.    All 14 point review of systems negative except as  described per history of present illness  EKGs/Labs/Other Studies Reviewed:    EKG Interpretation Date/Time:  Thursday October 01 2024 11:11:23 EST Ventricular Rate:  69 PR Interval:  206 QRS Duration:  168 QT Interval:  438 QTC Calculation: 469 R Axis:   76  Text Interpretation: Sinus rhythm with occasional Premature ventricular complexes Left bundle branch block When compared with ECG of 30-Sep-2023 09:44, Premature ventricular complexes are now Present QRS axis Shifted right Confirmed by Bernie Charleston [phone]) on 10/01/2024 11:15:15 AM    Recent Labs: No results found for requested labs within last 365 days.  Recent Lipid Panel    Component Value  Date/Time   CHOL 167 04/18/2023 1008   TRIG 222 (H) 04/18/2023 1008   HDL 42 04/18/2023 1008   CHOLHDL 4.0 04/18/2023 1008   LDLCALC 88 04/18/2023 1008    Physical Exam:    VS:  BP 94/62   Pulse 69   Ht 5' 8.5 (1.74 m)   Wt 228 lb (103.4 kg)   SpO2 95%   BMI 34.16 kg/m     Wt Readings from Last 3 Encounters:  10/01/24 228 lb (103.4 kg)  09/30/23 234 lb (106.1 kg)  03/21/23 234 lb (106.1 kg)     GEN:  Well nourished, well developed in no acute distress HEENT: Normal NECK: No JVD; No carotid bruits LYMPHATICS: No lymphadenopathy CARDIAC: RRR, no murmurs, no rubs, no gallops RESPIRATORY:  Clear to auscultation without rales, wheezing or rhonchi  ABDOMEN: Soft, non-tender, non-distended MUSCULOSKELETAL:  No edema; No deformity  SKIN: Warm and dry LOWER EXTREMITIES: no swelling NEUROLOGIC:  Alert and oriented x 3 PSYCHIATRIC:  Normal affect   ASSESSMENT:    1. Essential hypertension   2. LBBB (left bundle branch block)   3. Dilated cardiomyopathy (HCC)   4. Mixed hyperlipidemia    PLAN:    In order of problems listed above:  History of cardiomyopathy probably related to bundle branch block.  Will recheck echocardiogram since he does have a little more shortness of breath. Essential hypertension actually  blood pressure is low, if his ejection fraction is normal we probably do not Down some of his medications. Mixed dyslipidemia I did review [facility] which show me his LDL of 88 HDL 42 this is from 2024 will make arrangements for fasting lipid profile to be done.  In the meantime continue with rosuvastatin . Snoring I suspect sleep apnea because of snoring, waking up in the middle of the night with dry mouth and excessive daytime somnolence, will schedule him to have a sleep study   Medication Adjustments/Labs and Tests Ordered: Current medicines are reviewed at length with the patient today.  Concerns regarding medicines are outlined above.  Orders Placed This Encounter  Procedures   EKG 12-Lead   Medication changes: No orders of the defined types were placed in this encounter.   Signed, Lamar DOROTHA Fitch, MD, [facility] 10/01/2024 11:23 AM    [facility]    [1]  Current Meds  Medication Sig   aspirin EC 81 MG tablet Take 1 tablet by mouth daily.    carvedilol  (COREG ) 25 MG tablet Take 1 tablet by mouth twice daily   rosuvastatin  (CRESTOR ) 20 MG tablet Take 1 tablet (20 mg total) by mouth daily.   sacubitril-valsartan (ENTRESTO ) 49-51 MG Take 1 tablet by mouth 2 (two) times daily.   "

## 2024-10-01 NOTE — Patient Instructions (Addendum)
 Medication Instructions:  Your physician recommends that you continue on your current medications as directed. Please refer to the Current Medication list given to you today.  *If you need a refill on your cardiac medications before your next appointment, please call your pharmacy*  Lab Work: NONE If you have labs (blood work) drawn today and your tests are completely normal, you will receive your results only by: MyChart Message (if you have MyChart) OR A paper copy in the mail If you have any lab test that is abnormal or we need to change your treatment, we will call you to review the results.  Testing/Procedures: Your physician has requested that you have an echocardiogram. Echocardiography is a painless test that uses sound waves to create images of your heart. It provides your doctor with information about the size and shape of your heart and how well your heart's chambers and valves are working. This procedure takes approximately one hour. There are no restrictions for this procedure. Please do NOT wear cologne, perfume, aftershave, or lotions (deodorant is allowed). Please arrive 15 minutes prior to your appointment time.  Please note: We ask at that you not bring children with you during ultrasound (echo/ vascular) testing. Due to room size and safety concerns, children are not allowed in the ultrasound rooms during exams. Our front office staff cannot provide observation of children in our lobby area while testing is being conducted. An adult accompanying a patient to their appointment will only be allowed in the ultrasound room at the discretion of the ultrasound technician under special circumstances. We apologize for any inconvenience.   Follow-Up: At [facility], you and your health needs are our priority.  As part of our continuing mission to provide you with exceptional heart care, our providers are all part of one team.  This team includes your primary Cardiologist  (physician) and Advanced Practice Providers or APPs (Physician Assistants and Nurse Practitioners) who all work together to provide you with the care you need, when you need it.  Your next appointment:   1 year(s)  Provider:   Gypsy Balsam, MD    We recommend signing up for the patient portal called "MyChart".  Sign up information is provided on this After Visit Summary.  MyChart is used to connect with patients for Virtual Visits (Telemedicine).  Patients are able to view lab/test results, encounter notes, upcoming appointments, etc.  Non-urgent messages can be sent to your provider as well.   To learn more about what you can do with MyChart, go to [email or web address].   Other Instructions

## 2024-10-01 NOTE — Addendum Note (Signed)
 Addended by: ARLOA POTASH R on: 10/01/2024 11:37 AM   Modules accepted: Orders

## 2024-10-08 NOTE — Progress Notes (Signed)
 Remote PPM Transmission

## 2024-11-02 ENCOUNTER — Ambulatory Visit: Admitting: Cardiology

## 2024-11-02 ENCOUNTER — Ambulatory Visit

## 2024-12-29 ENCOUNTER — Ambulatory Visit

## 2025-03-30 ENCOUNTER — Ambulatory Visit

## 2025-06-29 ENCOUNTER — Ambulatory Visit

## 2025-09-28 ENCOUNTER — Ambulatory Visit

## 2025-12-28 ENCOUNTER — Ambulatory Visit
